# Patient Record
Sex: Male | Born: 1980 | Race: White | Hispanic: No | Marital: Married | Smoking: Current every day smoker
Health system: Southern US, Community
[De-identification: ages and names within clinical notes are randomized; demographics above are authoritative.]

## PROBLEM LIST (undated history)

## (undated) DIAGNOSIS — G4733 Obstructive sleep apnea (adult) (pediatric): Secondary | ICD-10-CM

## (undated) DIAGNOSIS — G4700 Insomnia, unspecified: Secondary | ICD-10-CM

## (undated) DIAGNOSIS — F419 Anxiety disorder, unspecified: Secondary | ICD-10-CM

## (undated) DIAGNOSIS — E669 Obesity, unspecified: Secondary | ICD-10-CM

## (undated) DIAGNOSIS — F17200 Nicotine dependence, unspecified, uncomplicated: Secondary | ICD-10-CM

## (undated) DIAGNOSIS — M25519 Pain in unspecified shoulder: Secondary | ICD-10-CM

## (undated) DIAGNOSIS — R0789 Other chest pain: Secondary | ICD-10-CM

## (undated) DIAGNOSIS — L409 Psoriasis, unspecified: Secondary | ICD-10-CM

## (undated) DIAGNOSIS — R202 Paresthesia of skin: Secondary | ICD-10-CM

## (undated) DIAGNOSIS — T7840XA Allergy, unspecified, initial encounter: Secondary | ICD-10-CM

## (undated) DIAGNOSIS — IMO0002 Reserved for concepts with insufficient information to code with codable children: Secondary | ICD-10-CM

## (undated) DIAGNOSIS — Z720 Tobacco use: Secondary | ICD-10-CM

## (undated) DIAGNOSIS — I10 Essential (primary) hypertension: Secondary | ICD-10-CM

## (undated) DIAGNOSIS — H938X3 Other specified disorders of ear, bilateral: Secondary | ICD-10-CM

## (undated) DIAGNOSIS — R200 Anesthesia of skin: Secondary | ICD-10-CM

## (undated) DIAGNOSIS — R5383 Other fatigue: Secondary | ICD-10-CM

## (undated) DIAGNOSIS — J309 Allergic rhinitis, unspecified: Secondary | ICD-10-CM

## (undated) HISTORY — PX: TENDON REPAIR: SHX5111

## (undated) HISTORY — DX: Allergy, unspecified, initial encounter: T78.40XA

## (undated) HISTORY — DX: Other chest pain: R07.89

## (undated) HISTORY — DX: Pain in unspecified shoulder: M25.519

## (undated) HISTORY — DX: Obesity, unspecified: E66.9

## (undated) HISTORY — DX: Anesthesia of skin: R20.0

## (undated) HISTORY — DX: Nicotine dependence, unspecified, uncomplicated: F17.200

## (undated) HISTORY — DX: Other specified disorders of ear, bilateral: H93.8X3

## (undated) HISTORY — DX: Obstructive sleep apnea (adult) (pediatric): G47.33

## (undated) HISTORY — DX: Tobacco use: Z72.0

## (undated) HISTORY — DX: Reserved for concepts with insufficient information to code with codable children: IMO0002

## (undated) HISTORY — DX: Allergic rhinitis, unspecified: J30.9

## (undated) HISTORY — DX: Psoriasis, unspecified: L40.9

## (undated) HISTORY — DX: Other fatigue: R53.83

## (undated) HISTORY — DX: Paresthesia of skin: R20.2

## (undated) HISTORY — DX: Essential (primary) hypertension: I10

## (undated) HISTORY — DX: Insomnia, unspecified: G47.00

---
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
## 2000-08-28 ENCOUNTER — Emergency Department (HOSPITAL_COMMUNITY): Admission: EM | Admit: 2000-08-28 | Discharge: 2000-08-28 | Payer: Self-pay | Admitting: Podiatry

## 2000-08-28 ENCOUNTER — Encounter: Payer: Self-pay | Admitting: Emergency Medicine

## 2000-08-31 ENCOUNTER — Ambulatory Visit (HOSPITAL_COMMUNITY): Admission: RE | Admit: 2000-08-31 | Discharge: 2000-08-31 | Payer: Self-pay | Admitting: *Deleted

## 2000-08-31 ENCOUNTER — Encounter: Payer: Self-pay | Admitting: *Deleted

## 2003-05-10 ENCOUNTER — Emergency Department (HOSPITAL_COMMUNITY): Admission: EM | Admit: 2003-05-10 | Discharge: 2003-05-10 | Payer: Self-pay | Admitting: Emergency Medicine

## 2004-12-30 ENCOUNTER — Emergency Department (HOSPITAL_COMMUNITY): Admission: EM | Admit: 2004-12-30 | Discharge: 2004-12-30 | Payer: Self-pay | Admitting: Emergency Medicine

## 2005-04-28 ENCOUNTER — Emergency Department (HOSPITAL_COMMUNITY): Admission: EM | Admit: 2005-04-28 | Discharge: 2005-04-29 | Payer: Self-pay | Admitting: Emergency Medicine

## 2005-10-29 ENCOUNTER — Emergency Department (HOSPITAL_COMMUNITY): Admission: EM | Admit: 2005-10-29 | Discharge: 2005-10-30 | Payer: Self-pay | Admitting: Emergency Medicine

## 2010-07-30 ENCOUNTER — Encounter: Payer: Self-pay | Admitting: *Deleted

## 2010-07-31 ENCOUNTER — Institutional Professional Consult (permissible substitution): Payer: Self-pay | Admitting: Cardiovascular Disease

## 2010-08-13 ENCOUNTER — Institutional Professional Consult (permissible substitution): Payer: Self-pay | Admitting: Cardiovascular Disease

## 2011-12-07 ENCOUNTER — Other Ambulatory Visit: Payer: Self-pay | Admitting: Family Medicine

## 2011-12-08 NOTE — Telephone Encounter (Signed)
Chart pulled to PA pool at nurse station [phone]

## 2012-07-13 ENCOUNTER — Emergency Department (HOSPITAL_COMMUNITY)
Admission: EM | Admit: 2012-07-13 | Discharge: 2012-07-13 | Disposition: A | Discharge status: Home / Self Care | Payer: Worker's Compensation | Attending: Emergency Medicine | Admitting: Emergency Medicine

## 2012-07-13 ENCOUNTER — Encounter (HOSPITAL_COMMUNITY): Payer: Self-pay

## 2012-07-13 DIAGNOSIS — Z77098 Contact with and (suspected) exposure to other hazardous, chiefly nonmedicinal, chemicals: Secondary | ICD-10-CM | POA: Insufficient documentation

## 2012-07-13 DIAGNOSIS — Z8739 Personal history of other diseases of the musculoskeletal system and connective tissue: Secondary | ICD-10-CM | POA: Insufficient documentation

## 2012-07-13 DIAGNOSIS — E669 Obesity, unspecified: Secondary | ICD-10-CM | POA: Insufficient documentation

## 2012-07-13 MED ORDER — ALBUTEROL SULFATE (5 MG/ML) 0.5% IN NEBU
5.0000 mg | INHALATION_SOLUTION | Freq: Once | RESPIRATORY_TRACT | Status: AC
  Administered 2012-07-13: 5 mg via RESPIRATORY_TRACT
  Filled 2012-07-13: qty 1

## 2012-07-13 MED ORDER — IPRATROPIUM BROMIDE 0.02 % IN SOLN
0.5000 mg | Freq: Once | RESPIRATORY_TRACT | Status: AC
  Administered 2012-07-13: 0.5 mg via RESPIRATORY_TRACT
  Filled 2012-07-13: qty 2.5

## 2012-07-13 NOTE — ED Provider Notes (Signed)
History     CSN: [id number]  Arrival date & time 07/13/12  1421   First MD Initiated Contact with Patient 07/13/12 1456      Chief Complaint  Patient presents with  . Chemical Exposure    (Consider location/radiation/quality/duration/timing/severity/associated sxs/prior treatment) HPI Comments: Patient presents with complaint of chlorine exposure 5 days ago while at work. He was working on a pump at a swimming pool. Patient states that that evening over the weekend he had a burning feeling in his chest and throat. This has since resolved and the patient's only current complaint is becoming short of breath more quickly while at work. Patient was told by his employer to come to the emergency department for evaluation. No history of asthma or other heart or lung problems. Onset symptoms acute. Course is resolved. Nothing makes symptoms better or worse.  The history is provided by the patient.    Past Medical History  Diagnosis Date  . Non-cardiac chest pain   . Smoker   . Obese   . Herniated disc     History reviewed. No pertinent past surgical history.  Family History  Problem Relation Age of Onset  . Heart attack Father   . Stroke Father   . Heart attack Paternal Uncle     History  Substance Use Topics  . Smoking status: Not on file  . Smokeless tobacco: Not on file  . Alcohol Use: Not on file      Review of Systems  Constitutional: Negative for fever.  HENT: Positive for sore throat (resolved). Negative for rhinorrhea.   Eyes: Negative for redness.  Respiratory: Positive for shortness of breath (resolved). Negative for cough.   Cardiovascular: Negative for chest pain.  Gastrointestinal: Negative for nausea, vomiting, abdominal pain and diarrhea.  Genitourinary: Negative for dysuria.  Musculoskeletal: Negative for myalgias.  Skin: Negative for rash.  Neurological: Negative for headaches.    Allergies  Review of patient's allergies indicates no known  allergies.  Home Medications   Current Outpatient Rx  Name  Route  Sig  Dispense  Refill  . clonazePAM (KLONOPIN) 0.5 MG tablet   Oral   Take 0.5 mg by mouth 2 (two) times daily as needed for anxiety.           BP 148/91  Pulse 100  Temp(Src) 98.5 F (36.9 C) (Oral)  Resp 18  SpO2 96%  Physical Exam  Nursing note and vitals reviewed. Constitutional: He appears well-developed and well-nourished.  HENT:  Head: Normocephalic and atraumatic.  Nose: Nose normal. No mucosal edema or rhinorrhea.  Mouth/Throat: Oropharynx is clear and moist and mucous membranes are normal. No edematous. No oropharyngeal exudate, posterior oropharyngeal edema or posterior oropharyngeal erythema.  Eyes: Conjunctivae are normal. Right eye exhibits no discharge. Left eye exhibits no discharge.  Neck: Normal range of motion. Neck supple.  Cardiovascular: Normal rate, regular rhythm and normal heart sounds.   Pulmonary/Chest: Effort normal and breath sounds normal. No respiratory distress. He has no wheezes. He has no rales.  Abdominal: Soft. There is no tenderness.  Neurological: He is alert.  Skin: Skin is warm and dry.  Psychiatric: He has a normal mood and affect.    ED Course  Procedures (including critical care time)  Labs Reviewed - No data to display No results found.   1. Exposure to chemical inhalation     3:05 PM Patient seen and examined. I have spoken with [facility]. They recommend O2 if needed, breathing treatment. Otherwise  continued supportive care and cool liquids for throat irritation.   Vital signs reviewed and are as follows: Filed Vitals:   07/13/12 1455  BP: 148/91  Pulse: 100  Temp: 98.5 F (36.9 C)  Resp: 18   3:57 PM Pt states he feels shaky, breathing better after treatment. Discussed need to avoid exposure, return with worsening symptoms or shortness of breath. Patient verbalizes understanding and agrees with plan.     MDM  Inhalation  exposure to irritant, improving. No concern for d/c. Reccs from poison control.        Renne Crigler, PA-C 07/13/12 [phone]

## 2012-07-13 NOTE — ED Provider Notes (Signed)
Medical screening examination/treatment/procedure(s) were performed by non-physician practitioner and as supervising physician I was immediately available for consultation/collaboration.  Bessie Boyte R. Laticha Ferrucci, MD 07/13/12 2328 

## 2012-07-13 NOTE — ED Notes (Addendum)
Pt presents with c/o chemical exposure that happened on 5/9. 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 No acute distress at this time. Pt was experiencing shortness of breath and chest pain over the weekend. Still feels a burning sensation in his chest and some mild shortness of breath at this time.

## 2013-04-09 ENCOUNTER — Encounter (HOSPITAL_COMMUNITY): Payer: Self-pay | Admitting: Emergency Medicine

## 2013-04-09 ENCOUNTER — Emergency Department (HOSPITAL_COMMUNITY)
Admission: EM | Admit: 2013-04-09 | Discharge: 2013-04-09 | Disposition: A | Discharge status: Home / Self Care | Payer: BC Managed Care – PPO | Source: Home / Self Care | Attending: Emergency Medicine | Admitting: Emergency Medicine

## 2013-04-09 ENCOUNTER — Emergency Department (HOSPITAL_COMMUNITY)
Admission: EM | Admit: 2013-04-09 | Discharge: 2013-04-10 | Disposition: A | Discharge status: Home / Self Care | Payer: BC Managed Care – PPO | Attending: Emergency Medicine | Admitting: Emergency Medicine

## 2013-04-09 DIAGNOSIS — K5289 Other specified noninfective gastroenteritis and colitis: Secondary | ICD-10-CM

## 2013-04-09 DIAGNOSIS — E669 Obesity, unspecified: Secondary | ICD-10-CM | POA: Insufficient documentation

## 2013-04-09 DIAGNOSIS — L539 Erythematous condition, unspecified: Secondary | ICD-10-CM | POA: Insufficient documentation

## 2013-04-09 DIAGNOSIS — Z8739 Personal history of other diseases of the musculoskeletal system and connective tissue: Secondary | ICD-10-CM | POA: Insufficient documentation

## 2013-04-09 DIAGNOSIS — G43909 Migraine, unspecified, not intractable, without status migrainosus: Secondary | ICD-10-CM | POA: Insufficient documentation

## 2013-04-09 DIAGNOSIS — K529 Noninfective gastroenteritis and colitis, unspecified: Secondary | ICD-10-CM

## 2013-04-09 DIAGNOSIS — Z79899 Other long term (current) drug therapy: Secondary | ICD-10-CM | POA: Insufficient documentation

## 2013-04-09 DIAGNOSIS — F17200 Nicotine dependence, unspecified, uncomplicated: Secondary | ICD-10-CM | POA: Insufficient documentation

## 2013-04-09 DIAGNOSIS — F411 Generalized anxiety disorder: Secondary | ICD-10-CM | POA: Insufficient documentation

## 2013-04-09 HISTORY — DX: Anxiety disorder, unspecified: F41.9

## 2013-04-09 LAB — CBC WITH DIFFERENTIAL/PLATELET
Basophils Absolute: 0 10*3/uL (ref 0.0–0.1)
Basophils Relative: 0 % (ref 0–1)
EOS ABS: 0.1 10*3/uL (ref 0.0–0.7)
Eosinophils Relative: 1 % (ref 0–5)
HCT: 46.6 % (ref 39.0–52.0)
Hemoglobin: 16.6 g/dL (ref 13.0–17.0)
LYMPHS PCT: 12 % (ref 12–46)
Lymphs Abs: 1.4 10*3/uL (ref 0.7–4.0)
MCH: 29.9 pg (ref 26.0–34.0)
MCHC: 35.6 g/dL (ref 30.0–36.0)
MCV: 84 fL (ref 78.0–100.0)
Monocytes Absolute: 0.6 10*3/uL (ref 0.1–1.0)
Monocytes Relative: 5 % (ref 3–12)
Neutro Abs: 9.6 10*3/uL — ABNORMAL HIGH (ref 1.7–7.7)
Neutrophils Relative %: 82 % — ABNORMAL HIGH (ref 43–77)
PLATELETS: 236 10*3/uL (ref 150–400)
RBC: 5.55 MIL/uL (ref 4.22–5.81)
RDW: 13.1 % (ref 11.5–15.5)
WBC: 11.7 10*3/uL — AB (ref 4.0–10.5)

## 2013-04-09 LAB — POCT I-STAT, CHEM 8
BUN: 8 mg/dL (ref 6–23)
Calcium, Ion: 1.04 mmol/L — ABNORMAL LOW (ref 1.12–1.23)
Chloride: 108 mEq/L (ref 96–112)
Creatinine, Ser: 0.9 mg/dL (ref 0.50–1.35)
Glucose, Bld: 108 mg/dL — ABNORMAL HIGH (ref 70–99)
HEMATOCRIT: 52 % (ref 39.0–52.0)
HEMOGLOBIN: 17.7 g/dL — AB (ref 13.0–17.0)
POTASSIUM: 4.6 meq/L (ref 3.7–5.3)
SODIUM: 139 meq/L (ref 137–147)
TCO2: 24 mmol/L (ref 0–100)

## 2013-04-09 MED ORDER — HYDROMORPHONE HCL PF 1 MG/ML IJ SOLN
INTRAMUSCULAR | Status: AC
  Filled 2013-04-09: qty 1

## 2013-04-09 MED ORDER — ONDANSETRON HCL 4 MG/2ML IJ SOLN
4.0000 mg | Freq: Once | INTRAMUSCULAR | Status: AC
  Administered 2013-04-09: 4 mg via INTRAVENOUS

## 2013-04-09 MED ORDER — SODIUM CHLORIDE 0.9 % IV SOLN: INTRAVENOUS | Status: DC

## 2013-04-09 MED ORDER — HYDROMORPHONE HCL PF 1 MG/ML IJ SOLN
1.0000 mg | Freq: Once | INTRAMUSCULAR | Status: AC
  Administered 2013-04-09: 1 mg via INTRAMUSCULAR

## 2013-04-09 MED ORDER — ONDANSETRON 4 MG PO TBDP
ORAL_TABLET | ORAL | Status: AC
  Filled 2013-04-09: qty 1

## 2013-04-09 MED ORDER — PROMETHAZINE HCL 25 MG/ML IJ SOLN
25.0000 mg | Freq: Once | INTRAMUSCULAR | Status: AC
  Administered 2013-04-10: 25 mg via INTRAVENOUS
  Filled 2013-04-09: qty 1

## 2013-04-09 MED ORDER — SODIUM CHLORIDE 0.9 % IV BOLUS (SEPSIS)
1000.0000 mL | Freq: Once | INTRAVENOUS | Status: AC
  Administered 2013-04-09: 1000 mL via INTRAVENOUS

## 2013-04-09 NOTE — Discharge Instructions (Signed)
We have determined that your problem requires further evaluation in the emergency department.  We will take care of your transport there.  Once at the emergency department, you will be evaluated by a provider and they will order whatever treatment or tests they deem necessary.  We cannot guarantee that they will do any specific test or do any specific treatment.  ° °

## 2013-04-09 NOTE — ED Notes (Signed)
zofran given orally. Pt tolerated well.

## 2013-04-09 NOTE — ED Notes (Addendum)
Patient c/o flu like sx including cough, fever, bodyaches, and nausea and vomitting x 1 day. Pt reports he has been around family members who are also sick with flu like sx. Patient reports he took Tylenol today but then vomitted about 30 mins later. Pt is alert and oriented. Face is red and patient reports he vomitted once in the lobby and still feels nauseous. Has chills currently. Given warm blanket.

## 2013-04-09 NOTE — ED Notes (Signed)
C/o nausea, vomiting, and diarrhea since last night.  States [facility] and given pain medication (for headache) and Zofran.  Denies pain or nausea at this time.

## 2013-04-09 NOTE — ED Provider Notes (Signed)
Chief Complaint   Chief Complaint  Patient presents with  . Influenza     History of Present Illness   Douglas Vazquez is a 33 year old male since yesterday evening and has had nausea, vomiting, and diarrhea. His wife thinks that some of the initial emesis might of been coffee-ground emesis, however I was able to observe his emesis today and it looks like yellow mucus. The patient estimates that he vomited twice yesterday and 5-6 times today. He had one or 2 diarrheal stools yesterday and 6-7 today. He also notes cramps and borborygmus. He's been weak, dizzy, had difficulty ambulating. He's also had some rhinorrhea and some wheezing. Denies any fever or chills. No blood in the stool. No specific exposures. No suspicious ingestions.  Review of Systems   Other than as noted above, the patient denies any of the following symptoms: Systemic:  No fevers, chills, sweats, weight loss or gain, fatigue, or tiredness. ENT:  No nasal congestion, rhinorrhea, or sore throat. Lungs:  No cough, wheezing, or shortness of breath. Cardiac:  No chest pain, syncope, or presyncope. GI:  No abdominal pain, nausea, vomiting, anorexia, diarrhea, constipation, blood in stool or vomitus. GU:  No dysuria, frequency, or urgency.  PMFSH   Past medical history, family history, social history, meds, and allergies were reviewed.  His only medication is clonazepam.  Physical Exam     Vital signs:  BP 151/107  Pulse 98  Temp(Src) 98.7 F (37.1 C) (Oral)  Resp 18  SpO2 98% Filed Vitals:   04/09/13 1932 04/09/13 2025 Supine 04/09/13 2028 Sitting  04/09/13 2029 Standing   BP: 150/101 146/84 137/84 151/107  Pulse: 72 94 89 98  Temp: 98.7 F (37.1 C)     TempSrc: Oral     Resp: 18     SpO2: 98%       General:  Alert and oriented.  In no distress.  Skin warm and dry.  Good skin turgor, brisk capillary refill. ENT:  No scleral icterus, moist mucous membranes, no oral lesions, pharynx clear. Lungs:   Breath sounds clear and equal bilaterally.  No wheezes, rales, or rhonchi. Heart:  Rhythm regular, without extrasystoles.  No gallops or murmers. Abdomen:  Soft, flat, nondistended no organomegaly or mass. Bowel sounds were hyperactive. No tenderness to palpation. Skin: Clear, warm, and dry.  Good turgor.  Brisk capillary refill.  Labs   Results for orders placed during the hospital encounter of 04/09/13  POCT I-STAT, CHEM 8      Result Value Range   Sodium 139  137 - 147 mEq/L   Potassium 4.6  3.7 - 5.3 mEq/L   Chloride 108  96 - 112 mEq/L   BUN 8  6 - 23 mg/dL   Creatinine, Ser 9.81  0.50 - 1.35 mg/dL   Glucose, Bld 191 (*) 70 - 99 mg/dL   Calcium, Ion 4.78 (*) 1.12 - 1.23 mmol/L   TCO2 24  0 - 100 mmol/L   Hemoglobin 17.7 (*) 13.0 - 17.0 g/dL   HCT 29.5  62.1 - 30.8 %     Course in Urgent Care Center   He was given Zofran 4 mg IM and Dilaudid 1 mg IM because of a severe headache. We attempted to start IV normal saline but were unable to secure an IV line. This is possibly due to dehydration.   Assessment   The encounter diagnosis was Gastroenteritis.  Plan   The patient was transferred to the ED via shuttle in  stable condition.  Medical Decision Making   33 year old male with 2 day history of multiple episodes of emesis and diarrhea.  We were unable to establish an IV here due to dehydration.  I believe he needs IV fluids.  Will send via shuttle.         Reuben Likesavid C Adrien Shankar, MD 04/09/13 2106

## 2013-04-10 LAB — COMPREHENSIVE METABOLIC PANEL
ALT: 35 U/L (ref 0–53)
AST: 22 U/L (ref 0–37)
Albumin: 4.1 g/dL (ref 3.5–5.2)
Alkaline Phosphatase: 119 U/L — ABNORMAL HIGH (ref 39–117)
BUN: 8 mg/dL (ref 6–23)
CO2: 24 mEq/L (ref 19–32)
Calcium: 9.6 mg/dL (ref 8.4–10.5)
Chloride: 105 mEq/L (ref 96–112)
Creatinine, Ser: 0.81 mg/dL (ref 0.50–1.35)
GFR calc non Af Amer: >90 mL/min (ref >90)
Glucose, Bld: 101 mg/dL — ABNORMAL HIGH (ref 70–99)
POTASSIUM: 4.2 meq/L (ref 3.7–5.3)
SODIUM: 143 meq/L (ref 137–147)
TOTAL PROTEIN: 8.1 g/dL (ref 6.0–8.3)
Total Bilirubin: 0.6 mg/dL (ref 0.3–1.2)

## 2013-04-10 LAB — LIPASE, BLOOD: Lipase: 33 U/L (ref 11–59)

## 2013-04-10 MED ORDER — PROMETHAZINE HCL 25 MG PO TABS: 25.0000 mg | ORAL_TABLET | Freq: Four times a day (QID) | ORAL | Status: DC | PRN

## 2013-04-10 NOTE — ED Provider Notes (Signed)
CSN: [id number]     Arrival date & time 04/09/13  2112 History   First MD Initiated Contact with Patient 04/09/13 2335     Chief Complaint  Patient presents with  . Emesis  . Diarrhea   (Consider location/radiation/quality/duration/timing/severity/associated sxs/prior Treatment) HPI Comments: Patient is 33 year old male with PMHx of migraine headaches who presented to Urgent Care with nausea, vomiting and diarrhea since yesterday - he reports multiple episodes of NBNB vomiting.  He states diarrhea without blood x 2 episodes yesterday but none today.  He denies fever, chills, abdominal pain, constipation, dysuria, hematuria.  He reports no other sick contacts.  He reports headache started with this as well.  He denies photophobia as well.  He was sent here for IV fluids.  Since waiting here for several hours he states that the nausea returned but no active vomiting.  Patient is a 33 y.o. male presenting with vomiting and diarrhea. The history is provided by the patient. No language interpreter was used.  Emesis Severity:  Moderate Timing:  Constant Number of daily episodes:  5-6 Quality:  Stomach contents Progression:  Worsening Associated symptoms: diarrhea   Diarrhea Associated symptoms: vomiting     Past Medical History  Diagnosis Date  . Non-cardiac chest pain   . Smoker   . Obese   . Herniated disc   . Anxiety    History reviewed. No pertinent past surgical history. Family History  Problem Relation Age of Onset  . Heart attack Father   . Stroke Father   . Heart attack Paternal Uncle    History  Substance Use Topics  . Smoking status: Current Every Day Smoker -- 0.50 packs/day  . Smokeless tobacco: Not on file  . Alcohol Use: No    Review of Systems  Gastrointestinal: Positive for vomiting and diarrhea.  All other systems reviewed and are negative.    Allergies  Review of patient's allergies indicates no known allergies.  Home Medications   Current Outpatient  Rx  Name  Route  Sig  Dispense  Refill  . acetaminophen (TYLENOL) 500 MG tablet   Oral   Take 1,000 mg by mouth every 8 (eight) hours as needed for moderate pain or headache.         . clonazePAM (KLONOPIN) 0.5 MG tablet   Oral   Take 0.5 mg by mouth daily as needed for anxiety.          . promethazine (PHENERGAN) 25 MG tablet   Oral   Take 1 tablet (25 mg total) by mouth every 6 (six) hours as needed for nausea or vomiting.   30 tablet   0    BP 151/109  Pulse 97  Temp(Src) 98.1 F (36.7 C) (Oral)  Resp 20  Ht 5\' 9"  (1.753 m)  Wt 233 lb (105.688 kg)  BMI 34.39 kg/m2  SpO2 97% Physical Exam  Nursing note and vitals reviewed. Constitutional: He is oriented to person, place, and time. He appears well-developed and well-nourished. No distress.  HENT:  Head: Normocephalic and atraumatic.  Right Ear: External ear normal.  Left Ear: External ear normal.  Nose: Nose normal.  Mouth/Throat: Oropharynx is clear and moist. No oropharyngeal exudate.  Mild periorbital erythema  Eyes: Conjunctivae are normal. Pupils are equal, round, and reactive to light. No scleral icterus.  Neck: Normal range of motion. Neck supple.  Cardiovascular: Normal rate, regular rhythm and normal heart sounds.  Exam reveals no gallop and no friction rub.   No  murmur heard. Pulmonary/Chest: Effort normal and breath sounds normal. No respiratory distress. He has no wheezes. He has no rales. He exhibits no tenderness.  Abdominal: Soft. Bowel sounds are normal. He exhibits no distension. There is no tenderness. There is no rebound and no guarding.  Musculoskeletal: Normal range of motion. He exhibits no edema and no tenderness.  Lymphadenopathy:    He has no cervical adenopathy.  Neurological: He is alert and oriented to person, place, and time. He exhibits normal muscle tone. Coordination normal.  Skin: Skin is warm and dry. No rash noted. No erythema. No pallor.  Psychiatric: He has a normal mood and  affect. His behavior is normal. Judgment and thought content normal.    ED Course  Procedures (including critical care time) Labs Review Labs Reviewed  CBC WITH DIFFERENTIAL - Abnormal; Notable for the following:    WBC 11.7 (*)    Neutrophils Relative % 82 (*)    Neutro Abs 9.6 (*)    All other components within normal limits  COMPREHENSIVE METABOLIC PANEL - Abnormal; Notable for the following:    Glucose, Bld 101 (*)    Alkaline Phosphatase 119 (*)    All other components within normal limits  LIPASE, BLOOD  URINALYSIS, ROUTINE W REFLEX MICROSCOPIC   Imaging Review No results found.  EKG Interpretation   None       MDM   1. Gastroenteritis    Patient reports improvement in symptoms after a liter of fluids and phenergan here - will give rx for same and discharge home.  No concern for acute abdominal emergency such as appendicitis, perforated ulcer, colitis, diverticulitis or cholecystitis.   Izola PriceFrances C. Marisue HumbleSanford, PA-C 04/10/13 0130

## 2013-04-10 NOTE — Discharge Instructions (Signed)
Viral Gastroenteritis Viral gastroenteritis is also known as stomach flu. This condition affects the stomach and intestinal tract. It can cause sudden diarrhea and vomiting. The illness typically lasts 3 to 8 days. Most people develop an immune response that eventually gets rid of the virus. While this natural response develops, the virus can make you quite ill. CAUSES  Many different viruses can cause gastroenteritis, such as rotavirus or noroviruses. You can catch one of these viruses by consuming contaminated food or water. You may also catch a virus by sharing utensils or other personal items with an infected person or by touching a contaminated surface. SYMPTOMS  The most common symptoms are diarrhea and vomiting. These problems can cause a severe loss of body fluids (dehydration) and a body salt (electrolyte) imbalance. Other symptoms may include:  Fever.  Headache.  Fatigue.  Abdominal pain. DIAGNOSIS  Your caregiver can usually diagnose viral gastroenteritis based on your symptoms and a physical exam. A stool sample may also be taken to test for the presence of viruses or other infections. TREATMENT  This illness typically goes away on its own. Treatments are aimed at rehydration. The most serious cases of viral gastroenteritis involve vomiting so severely that you are not able to keep fluids down. In these cases, fluids must be given through an intravenous line (IV). HOME CARE INSTRUCTIONS   Drink enough fluids to keep your urine clear or pale yellow. Drink small amounts of fluids frequently and increase the amounts as tolerated.  Ask your caregiver for specific rehydration instructions.  Avoid:  Foods high in sugar.  Alcohol.  Carbonated drinks.  Tobacco.  Juice.  Caffeine drinks.  Extremely hot or cold fluids.  Fatty, greasy foods.  Too much intake of anything at one time.  Dairy products until 24 to 48 hours after diarrhea stops.  You may consume probiotics.  Probiotics are active cultures of beneficial bacteria. They may lessen the amount and number of diarrheal stools in adults. Probiotics can be found in yogurt with active cultures and in supplements.  Wash your hands well to avoid spreading the virus.  Only take over-the-counter or prescription medicines for pain, discomfort, or fever as directed by your caregiver. Do not give aspirin to children. Antidiarrheal medicines are not recommended.  Ask your caregiver if you should continue to take your regular prescribed and over-the-counter medicines.  Keep all follow-up appointments as directed by your caregiver. SEEK IMMEDIATE MEDICAL CARE IF:   You are unable to keep fluids down.  You do not urinate at least once every 6 to 8 hours.  You develop shortness of breath.  You notice blood in your stool or vomit. This may look like coffee grounds.  You have abdominal pain that increases or is concentrated in one small area (localized).  You have persistent vomiting or diarrhea.  You have a fever.  The patient is a child younger than 3 months, and he or she has a fever.  The patient is a child older than 3 months, and he or she has a fever and persistent symptoms.  The patient is a child older than 3 months, and he or she has a fever and symptoms suddenly get worse.  The patient is a baby, and he or she has no tears when crying. MAKE SURE YOU:   Understand these instructions.  Will watch your condition.  Will get help right away if you are not doing well or get worse. Document Released: 02/16/2005 Document Revised: 05/11/2011 Document Reviewed: 12/03/2010   Exit[employer] Patient Information 2014 [employer].  

## 2013-04-10 NOTE — ED Provider Notes (Signed)
Medical screening examination/treatment/procedure(s) were performed by non-physician practitioner and as supervising physician I was immediately available for consultation/collaboration.  EKG Interpretation   None        Sunnie NielsenBrian Kaedin Hicklin, MD 04/10/13 2308

## 2013-08-26 ENCOUNTER — Ambulatory Visit (INDEPENDENT_AMBULATORY_CARE_PROVIDER_SITE_OTHER): Payer: BC Managed Care – PPO | Admitting: Family Medicine

## 2013-08-26 VITALS — BP 122/90 | HR 96 | Temp 98.8°F | Resp 16 | Ht 68.0 in | Wt 236.0 lb

## 2013-08-26 DIAGNOSIS — B029 Zoster without complications: Secondary | ICD-10-CM

## 2013-08-26 MED ORDER — VALACYCLOVIR HCL 1 G PO TABS: 1000.0000 mg | ORAL_TABLET | Freq: Three times a day (TID) | ORAL | Status: DC

## 2013-08-26 MED ORDER — OXYCODONE-ACETAMINOPHEN 7.5-325 MG PO TABS: 1.0000 | ORAL_TABLET | ORAL | Status: DC | PRN

## 2013-08-26 NOTE — Patient Instructions (Signed)
Shingles °Shingles (herpes zoster) is an infection that is caused by the same virus that causes chickenpox (varicella). The infection causes a painful skin rash and fluid-filled blisters, which eventually break open, crust over, and heal. It may occur in any area of the body, but it usually affects only one side of the body or face. The pain of shingles usually lasts about 1 month. However, some people with shingles may develop long-term (chronic) pain in the affected area of the body. °Shingles often occurs many years after the person had chickenpox. It is more common: °· In people older than 50 years. °· In people with weakened immune systems, such as those with HIV, AIDS, or cancer. °· In people taking medicines that weaken the immune system, such as transplant medicines. °· In people under great stress. °CAUSES  °Shingles is caused by the varicella zoster virus (VZV), which also causes chickenpox. After a person is infected with the virus, it can remain in the person's body for years in an inactive state (dormant). To cause shingles, the virus reactivates and breaks out as an infection in a nerve root. °The virus can be spread from person to person (contagious) through contact with open blisters of the shingles rash. It will only spread to people who have not had chickenpox. When these people are exposed to the virus, they may develop chickenpox. They will not develop shingles. Once the blisters scab over, the person is no longer contagious and cannot spread the virus to others. °SYMPTOMS  °Shingles shows up in stages. The initial symptoms may be pain, itching, and tingling in an area of the skin. This pain is usually described as burning, stabbing, or throbbing. In a few days or weeks, a painful red rash will appear in the area where the pain, itching, and tingling were felt. The rash is usually on one side of the body in a band or belt-like pattern. Then, the rash usually turns into fluid-filled blisters. They  will scab over and dry up in approximately 2-3 weeks. °Flu-like symptoms may also occur with the initial symptoms, the rash, or the blisters. These may include: °· Fever. °· Chills. °· Headache. °· Upset stomach. °DIAGNOSIS  °Your caregiver will perform a skin exam to diagnose shingles. Skin scrapings or fluid samples may also be taken from the blisters. This sample will be examined under a microscope or sent to a lab for further testing. °TREATMENT  °There is no specific cure for shingles. Your caregiver will likely prescribe medicines to help you manage the pain, recover faster, and avoid long-term problems. This may include antiviral drugs, anti-inflammatory drugs, and pain medicines. °HOME CARE INSTRUCTIONS  °· Take a cool bath or apply cool compresses to the area of the rash or blisters as directed. This may help with the pain and itching.   °· Only take over-the-counter or prescription medicines as directed by your caregiver.   °· Rest as directed by your caregiver. °· Keep your rash and blisters clean with mild soap and cool water or as directed by your caregiver.  °· Do not pick your blisters or scratch your rash. Apply an anti-itch cream or numbing creams to the affected area as directed by your caregiver. °· Keep your shingles rash covered with a loose bandage (dressing). °· Avoid skin contact with: °¨ Babies.   °¨ Pregnant women.   °¨ Children with eczema.   °¨ Elderly people with transplants.   °¨ People with chronic illnesses, such as leukemia or AIDS.   °· Wear loose-fitting clothing to help ease the   pain of material rubbing against the rash. °· Keep all follow-up appointments with your caregiver. If the area involved is on your face, you may receive a referral for follow-up to a specialist, such as an eye doctor (ophthalmologist) or an ear, nose, and throat (ENT) doctor. Keeping all follow-up appointments will help you avoid eye complications, chronic pain, or disability.   °SEEK IMMEDIATE MEDICAL  CARE IF:  °· You have facial pain, pain around the eye area, or loss of feeling on one side of your face. °· You have ear pain or ringing in your ear. °· You have loss of taste. °· Your pain is not relieved with prescribed medicines.   °· Your redness or swelling spreads.   °· You have more pain and swelling.  °· Your condition is worsening or has changed.   °· You have a fever or persistent symptoms for more than 2-3 days. °· You have a fever and your symptoms suddenly get worse. °MAKE SURE YOU: °· Understand these instructions. °· Will watch your condition. °· Will get help right away if you are not doing well or get worse. °Document Released: 02/16/2005 Document Revised: 11/11/2011 Document Reviewed: 10/01/2011 °[employer]® Patient Information ©2015 [employer]. This information is not intended to replace advice given to you by your health care provider. Make sure you discuss any questions you have with your health care provider. ° °

## 2013-08-26 NOTE — Progress Notes (Signed)
The chart was scribed for Douglas SidleKurt Lauenstein, MD, by Douglas Vazquez, ED Scribe. This patient's care was started at 5:56 PM.  Patient ID: Douglas KindleBryan J Keeny MRN: [id number], DOB: [birth year], 33 y.o. Date of Encounter: 08/26/2013, 5:56 PM  Primary Physician: No PCP Per Patient  Chief Complaint:  Chief Complaint  Patient presents with   Herpes Zoster    Was diagnosed with shingles yesterday and given Zovirax but today he is much worse     HPI: 33 y.o. year old male with history below presents with a herpes zoster infection to his right hip which he first noticed yesterday. He states the swelling and redness associated with the infection have increased, and he reports  worsening pain. He also reports he had a headache this week prior to developing the lesion. The pt was treated in the ED yesterday for similar symptoms and prescribed acyclovir. Mr. Douglas Vazquez has had shingles one previously.   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 He has four children.    Past Medical History  Diagnosis Date   Non-cardiac chest pain    Smoker    Obese    Herniated disc    Anxiety      Home Meds: Prior to Admission medications   Medication Sig Start Date End Date Taking? Authorizing Provider  acyclovir (ZOVIRAX) 400 MG tablet Take 400 mg by mouth 5 (five) times daily.   Yes Historical Provider, MD  clonazePAM (KLONOPIN) 0.5 MG tablet Take 0.5 mg by mouth daily as needed for anxiety.     Historical Provider, MD    Allergies: No Known Allergies  History   Social History   Marital Status: Married    Spouse Name: N/A    Number of Children: N/A   Years of Education: N/A   Occupational History   Not on file.   Social History Main Topics   Smoking status: Current Every Day Smoker -- 0.50 packs/day   Smokeless tobacco: Not on file   Alcohol Use: No   Drug Use: No   Sexual Activity: Not on file   Other Topics Concern   Not on file   Social History Narrative     No narrative on file     Review of Systems: Constitutional: negative for chills, fever, night sweats, weight changes, or fatigue  HEENT: negative for vision changes, hearing loss, congestion, rhinorrhea, ST, epistaxis, or sinus pressure Cardiovascular: negative for chest pain or palpitations Respiratory: negative for hemoptysis, wheezing, shortness of breath, or cough Abdominal: negative for abdominal pain, nausea, vomiting, diarrhea, or constipation Dermatological: positive for a lesion, redness, and swelling  Neurologic: positive for a headache; negative for dizziness or syncope All other systems reviewed and are otherwise negative with the exception to those above and in the HPI.   Physical Exam: Triage Vitals: Blood pressure 122/90, pulse 96, temperature 98.8 F (37.1 C), resp. rate 16, height 5\' 8"  (1.727 m), weight 236 lb (107.049 kg), SpO2 96.00%., Body mass index is 35.89 kg/(m^2).  General: Well developed, well nourished, in no acute distress. Head: Normocephalic, atraumatic, eyes without discharge, sclera non-icteric, nares are without discharge. Bilateral auditory canals clear, TM's are without perforation, pearly grey and translucent with reflective cone of light bilaterally. Oral cavity moist, posterior pharynx without exudate, erythema, peritonsillar abscess, or post nasal drip.  Neck: Supple. No thyromegaly. Full ROM. No lymphadenopathy. Lungs: Clear bilaterally to auscultation without wheezes, rales, or rhonchi. Breathing is unlabored. Heart: RRR with S1 S2. No murmurs, rubs,  or gallops appreciated. Abdomen: Soft, non-tender, non-distended with normoactive bowel sounds. No hepatomegaly. No rebound/guarding. No obvious abdominal masses. Msk:  Strength and tone normal for age. Extremities/Skin: Warm and dry. No clubbing or cyanosis.  Patient has obvious herpetic lesions in the right inguinal crease which are tender and associated with an enlarged lymph node Neuro: Alert  and oriented X 3. Moves all extremities spontaneously. Gait is normal. CNII-XII grossly in tact. Psych:  Responds to questions appropriately with a normal affect.    ASSESSMENT AND PLAN:  33 y.o. year old male with Shingles - Plan: valACYclovir (VALTREX) 1000 MG tablet, oxyCODONE-acetaminophen (PERCOCET) 7.5-325 MG per tablet   Signed, Douglas SidleKurt Lauenstein, MD 08/26/2013 5:56 PM

## 2013-09-14 ENCOUNTER — Telehealth: Payer: Self-pay

## 2013-09-14 NOTE — Telephone Encounter (Signed)
Patient called and states he needs a refill on his pain medication. Please return call and advise. Thank you. CB # [phone]  - please leave message if he does not answer as he is at work right now.

## 2013-09-15 NOTE — Telephone Encounter (Signed)
Patient needs to be seen to get refill on this type of narcotic

## 2013-09-15 NOTE — Telephone Encounter (Signed)
Advised pt to RTC 

## 2013-10-11 ENCOUNTER — Ambulatory Visit (INDEPENDENT_AMBULATORY_CARE_PROVIDER_SITE_OTHER): Payer: BC Managed Care – PPO | Admitting: Emergency Medicine

## 2013-10-11 VITALS — BP 138/84 | HR 100 | Temp 98.8°F | Resp 16 | Ht 68.0 in | Wt 238.6 lb

## 2013-10-11 DIAGNOSIS — M5430 Sciatica, unspecified side: Secondary | ICD-10-CM

## 2013-10-11 DIAGNOSIS — M5431 Sciatica, right side: Secondary | ICD-10-CM

## 2013-10-11 MED ORDER — TRAMADOL HCL 50 MG PO TABS: 50.0000 mg | ORAL_TABLET | Freq: Four times a day (QID) | ORAL | Status: DC | PRN

## 2013-10-11 MED ORDER — NAPROXEN SODIUM 550 MG PO TABS: 550.0000 mg | ORAL_TABLET | Freq: Two times a day (BID) | ORAL | Status: DC

## 2013-10-11 NOTE — Progress Notes (Signed)
[facility] [address], [place] [address] [phone]  Date:  10/11/2013   Name:  Douglas Vazquez   DOB:  [birth year]   MRN:  [id number]  PCP:  No PCP Per Patient    Chief Complaint: Back Pain and Leg Pain   History of Present Illness:  Douglas Vazquez is a 33 y.o. 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  Improved.   Re-injured his back 09/30/13 when lifting a desk and has been out of work for the interval. Seen at ortho urgent care during interval and again at ortho last week and advised to return to work tomorrow with work restrictions and a follow up to discuss MRI 8/27.   Has pain in right low back into right anterior thigh.  Occasionally has numbness and tingling in right foot at times. Concerned about returning to work.  Finishes prednisone tomorrow. On no other medications No improvement with over the counter medications or other home remedies.  Denies other complaint or health concern today.   There are no active problems to display for this patient.   Past Medical History  Diagnosis Date  . Non-cardiac chest pain   . Smoker   . Obese   . Herniated disc   . Anxiety     Past Surgical History  Procedure Laterality Date  . Tendon repair Right     R index finger tendon repair     History  Substance Use Topics  . Smoking status: Current Every Day Smoker -- 0.50 packs/day  . Smokeless tobacco: Not on file  . Alcohol Use: No    Family History  Problem Relation Age of Onset  . Heart attack Father   . Stroke Father   . Hypertension Father   . Diabetes Father   . Heart attack Paternal Uncle   . Anxiety disorder Mother   . Dementia Paternal Grandmother     Allergies  Allergen Reactions  . Doxycycline Rash    Medication list has been reviewed and updated.  Current Outpatient Prescriptions on File Prior to Visit  Medication Sig Dispense Refill   . clonazePAM (KLONOPIN) 0.5 MG tablet Take 0.5 mg by mouth daily as needed for anxiety.        No current facility-administered medications on file prior to visit.    Review of Systems:  As per HPI, otherwise negative.    Physical Examination: Filed Vitals:   10/11/13 1751  BP: 138/84  Pulse: 100  Temp: 98.8 F (37.1 C)  Resp: 16   Filed Vitals:   10/11/13 1751  Height: 5\' 8"  (1.727 m)  Weight: 238 lb 9.6 oz (108.228 kg)   Body mass index is 36.29 kg/(m^2). Ideal Body Weight: Weight in (lb) to have BMI = 25: 164.1  GEN: WDWN, NAD, Non-toxic, A & O x 3 HEENT: Atraumatic, Normocephalic. Neck supple. No masses, No LAD. Ears and Nose: No external deformity. CV: RRR, No M/G/R. No JVD. No thrill. No extra heart sounds. PULM: CTA B, no wheezes, crackles, rhonchi. No retractions. No resp. distress. No accessory muscle use. ABD: S, NT, ND, +BS. No rebound. No HSM. EXTR: No c/c/e NEURO Normal gait. Some weakness right knee extension and absent patellar right PSYCH: Normally interactive. Conversant. Not depressed or anxious appearing.  Calm demeanor.    Assessment and Plan: Sciatic neuritis Tramadol Anaprox Follow up in 1 week if not improved otherwise  with ortho on 8/34  Signed,  Phillips OdorJeffery Elester Apodaca, MD

## 2013-10-11 NOTE — Addendum Note (Signed)
Addended by: Carmelina DaneANDERSON, Hosey Burmester S on: 10/11/2013 07:11 PM   Modules accepted: Level of Service

## 2013-10-11 NOTE — Patient Instructions (Signed)
Sciatic Sciatica Sciatica is pain, weakness, numbness, or tingling along the path of the sciatic nerve. The nerve starts in the lower back and runs down the back of each leg. The nerve controls the muscles in the lower leg and in the back of the knee, while also providing sensation to the back of the thigh, lower leg, and the sole of your foot. Sciatica is a symptom of another medical condition. For instance, nerve damage or certain conditions, such as a herniated disk or bone spur on the spine, pinch or put pressure on the sciatic nerve. This causes the pain, weakness, or other sensations normally associated with sciatica. Generally, sciatica only affects one side of the body. CAUSES   Herniated or slipped disc.  Degenerative disk disease.  A pain disorder involving the narrow muscle in the buttocks (piriformis syndrome).  Pelvic injury or fracture.  Pregnancy.  Tumor (rare). SYMPTOMS  Symptoms can vary from mild to very severe. The symptoms usually travel from the low back to the buttocks and down the back of the leg. Symptoms can include:  Mild tingling or dull aches in the lower back, leg, or hip.  Numbness in the back of the calf or sole of the foot.  Burning sensations in the lower back, leg, or hip.  Sharp pains in the lower back, leg, or hip.  Leg weakness.  Severe back pain inhibiting movement. These symptoms may get worse with coughing, sneezing, laughing, or prolonged sitting or standing. Also, being overweight may worsen symptoms. DIAGNOSIS  Your caregiver will perform a physical exam to look for common symptoms of sciatica. He or she may ask you to do certain movements or activities that would trigger sciatic nerve pain. Other tests may be performed to find the cause of the sciatica. These may include:  Blood tests.  X-rays.  Imaging tests, such as an MRI or CT scan. TREATMENT  Treatment is directed at the cause of the sciatic pain. Sometimes, treatment is not  necessary and the pain and discomfort goes away on its own. If treatment is needed, your caregiver may suggest:  Over-the-counter medicines to relieve pain.  Prescription medicines, such as anti-inflammatory medicine, muscle relaxants, or narcotics.  Applying heat or ice to the painful area.  Steroid injections to lessen pain, irritation, and inflammation around the nerve.  Reducing activity during periods of pain.  Exercising and stretching to strengthen your abdomen and improve flexibility of your spine. Your caregiver may suggest losing weight if the extra weight makes the back pain worse.  Physical therapy.  Surgery to eliminate what is pressing or pinching the nerve, such as a bone spur or part of a herniated disk. HOME CARE INSTRUCTIONS   Only take over-the-counter or prescription medicines for pain or discomfort as directed by your caregiver.  Apply ice to the affected area for 20 minutes, 3-4 times a day for the first 48-72 hours. Then try heat in the same way.  Exercise, stretch, or perform your usual activities if these do not aggravate your pain.  Attend physical therapy sessions as directed by your caregiver.  Keep all follow-up appointments as directed by your caregiver.  Do not wear high heels or shoes that do not provide proper support.  Check your mattress to see if it is too soft. A firm mattress may lessen your pain and discomfort. SEEK IMMEDIATE MEDICAL CARE IF:   You lose control of your bowel or bladder (incontinence).  You have increasing weakness in the lower back, pelvis,  buttocks, or legs.  You have redness or swelling of your back.  You have a burning sensation when you urinate.  You have pain that gets worse when you lie down or awakens you at night.  Your pain is worse than you have experienced in the past.  Your pain is lasting longer than 4 weeks.  You are suddenly losing weight without reason. MAKE SURE YOU:  Understand these  instructions.  Will watch your condition.  Will get help right away if you are not doing well or get worse. Document Released: 02/10/2001 Document Revised: 08/18/2011 Document Reviewed: 06/28/2011 [facility] Patient Information 2015 [place], [place]. This information is not intended to replace advice given to you by your health care provider. Make sure you discuss any questions you have with your health care provider.

## 2014-06-28 ENCOUNTER — Ambulatory Visit (INDEPENDENT_AMBULATORY_CARE_PROVIDER_SITE_OTHER): Payer: Managed Care, Other (non HMO) | Admitting: Physician Assistant

## 2014-06-28 VITALS — BP 140/98 | HR 96 | Temp 98.4°F | Resp 18 | Ht 67.75 in | Wt 237.4 lb

## 2014-06-28 DIAGNOSIS — I10 Essential (primary) hypertension: Secondary | ICD-10-CM | POA: Insufficient documentation

## 2014-06-28 DIAGNOSIS — R05 Cough: Secondary | ICD-10-CM

## 2014-06-28 DIAGNOSIS — J019 Acute sinusitis, unspecified: Secondary | ICD-10-CM

## 2014-06-28 DIAGNOSIS — J309 Allergic rhinitis, unspecified: Secondary | ICD-10-CM

## 2014-06-28 DIAGNOSIS — L409 Psoriasis, unspecified: Secondary | ICD-10-CM | POA: Diagnosis not present

## 2014-06-28 DIAGNOSIS — R059 Cough, unspecified: Secondary | ICD-10-CM

## 2014-06-28 HISTORY — DX: Psoriasis, unspecified: L40.9

## 2014-06-28 HISTORY — DX: Essential (primary) hypertension: I10

## 2014-06-28 HISTORY — DX: Allergic rhinitis, unspecified: J30.9

## 2014-06-28 LAB — COMPREHENSIVE METABOLIC PANEL
ALBUMIN: 4.3 g/dL (ref 3.5–5.2)
ALT: 35 U/L (ref 0–53)
AST: 20 U/L (ref 0–37)
Alkaline Phosphatase: 102 U/L (ref 39–117)
BUN: 10 mg/dL (ref 6–23)
CHLORIDE: 106 meq/L (ref 96–112)
CO2: 26 meq/L (ref 19–32)
Calcium: 9.7 mg/dL (ref 8.4–10.5)
Creat: 0.94 mg/dL (ref 0.50–1.35)
GLUCOSE: 83 mg/dL (ref 70–99)
POTASSIUM: 4 meq/L (ref 3.5–5.3)
SODIUM: 139 meq/L (ref 135–145)
Total Bilirubin: 0.6 mg/dL (ref 0.2–1.2)
Total Protein: 7.7 g/dL (ref 6.0–8.3)

## 2014-06-28 LAB — POCT CBC
GRANULOCYTE PERCENT: 73.2 % (ref 37–80)
HEMATOCRIT: 48.5 % (ref 43.5–53.7)
HEMOGLOBIN: 16.4 g/dL (ref 14.1–18.1)
Lymph, poc: 2.2 (ref 0.6–3.4)
MCH, POC: 28.4 pg (ref 27–31.2)
MCHC: 33.8 g/dL (ref 31.8–35.4)
MCV: 84 fL (ref 80–97)
MID (CBC): 0.9 (ref 0–0.9)
MPV: 7.8 fL (ref 0–99.8)
PLATELET COUNT, POC: 252 10*3/uL (ref 142–424)
POC Granulocyte: 8.4 — AB (ref 2–6.9)
POC LYMPH PERCENT: 18.7 %L (ref 10–50)
POC MID %: 8.1 % (ref 0–12)
RBC: 5.78 M/uL (ref 4.69–6.13)
RDW, POC: 13.6 %
WBC: 11.5 10*3/uL — AB (ref 4.6–10.2)

## 2014-06-28 MED ORDER — HYDROCHLOROTHIAZIDE 12.5 MG PO TABS: 12.5000 mg | ORAL_TABLET | Freq: Every day | ORAL | Status: DC

## 2014-06-28 MED ORDER — LEVOFLOXACIN 500 MG PO TABS: 500.0000 mg | ORAL_TABLET | Freq: Every day | ORAL | Status: DC

## 2014-06-28 MED ORDER — FLUTICASONE PROPIONATE 50 MCG/ACT NA SUSP: 2.0000 | Freq: Every day | NASAL | Status: DC

## 2014-06-28 MED ORDER — CETIRIZINE HCL 10 MG PO TABS: 10.0000 mg | ORAL_TABLET | Freq: Every day | ORAL | Status: DC

## 2014-06-28 NOTE — Patient Instructions (Addendum)
Take antibiotic once a day for 7 days. Use current nasal spray twice a day until you run out. Then use flonase daily. Take zyrtec or benadryl at night.  Take hydrochorithiazide every morning for blood pressure.

## 2014-06-28 NOTE — Progress Notes (Signed)
Subjective:    Patient ID: Douglas Vazquez, male    DOB: 1980/10/20, 34 y.o.   MRN: [id number]  HPI  This is a 34 year old male who is presenting with sinus congestion and pressure x 1 month. Went to [facility] at the start of symptoms - gave him 2 weeks of augmentin. States symptoms improved but did not resolve at the end of treatment. He felt better for 2 days and then symptoms returned. He states "I now feel as bad or worse than before". States he has pain over nasal bridge. States he is having a harder time breathing through mouth. Coughing to clear itch in throat. Cough is not productive. States he has full body myalgias and generally feels exhausted. Denies fever, chills, otalgia, sore throat. [facility] he was given cough syrup, augmentin and a nasal spray. States he feels he is too congested for the nasal spray. Feels the nasal spray drips out. He has a history of environmental allergies. No history of asthma. He is a current every day smoker. He smokes a little under one pack per day.  Patient reports he has had borderline blood pressure for a few years. When he was seen in clinic he had similar blood pressure as today, 150/90s. He does not have a primary provider. He just recently got insurance. He has never been on meds for BP. States he does not exercise and drinks several sodas a day. He knows he needs to lose weight. Patient is also wanting to quit cigarettes. He has been slowly cutting down over the past one year.  Patient has a diagnosis of psoriasis. It generally effects his scalp and eyebrows. Creams in the past have helped but over the past one year or his symptoms have worsened in the creams have been less effective. He has never seen a dermatologist. He states his mother and brother both have psoriasis.  Review of Systems  Constitutional: Positive for fatigue. Negative for fever and chills.  HENT: Positive for congestion and sinus pressure. Negative for ear pain and  sore throat.   Eyes: Positive for discharge and itching. Negative for redness.  Respiratory: Positive for cough. Negative for shortness of breath and wheezing.   Gastrointestinal: Negative for nausea, vomiting and abdominal pain.  Musculoskeletal: Positive for myalgias.  Skin: Positive for rash.  Allergic/Immunologic: Positive for environmental allergies.  Hematological: Negative for adenopathy.  Psychiatric/Behavioral: Positive for sleep disturbance.   There are no active problems to display for this patient.  Home meds: none  Allergies  Allergen Reactions  . Doxycycline Rash   Patient's social and family history were reviewed.     Objective:   Physical Exam  Constitutional: He is oriented to person, place, and time. He appears well-developed and well-nourished. No distress.  HENT:  Head: Normocephalic and atraumatic.  Right Ear: Hearing, tympanic membrane, external ear and ear canal normal.  Left Ear: Hearing, tympanic membrane, external ear and ear canal normal.  Nose: Mucosal edema present. Right sinus exhibits maxillary sinus tenderness. Right sinus exhibits no frontal sinus tenderness. Left sinus exhibits maxillary sinus tenderness. Left sinus exhibits no frontal sinus tenderness.  Mouth/Throat: Uvula is midline and mucous membranes are normal. Posterior oropharyngeal erythema present. No oropharyngeal exudate or posterior oropharyngeal edema.  Eyes: Conjunctivae are normal. Right eye exhibits discharge (watery). Left eye exhibits discharge (watery). Right conjunctiva is not injected. Left conjunctiva is not injected.  Cardiovascular: Normal rate, regular rhythm, normal heart sounds and normal pulses.  No murmur heard. Pulmonary/Chest: Effort normal and breath sounds normal. No respiratory distress. He has no wheezes. He has no rhonchi. He has no rales.  Musculoskeletal: Normal range of motion.  Lymphadenopathy:       Head (right side): No submental, no submandibular and no  tonsillar adenopathy present.       Head (left side): No submental, no submandibular and no tonsillar adenopathy present.    He has no cervical adenopathy.  Neurological: He is alert and oriented to person, place, and time.  Skin: Skin is warm and dry.  Flaking skin under bilateral eyebrows. Scattered scaling lesions in scalp. Scaling lesion on left elbow.  Psychiatric: He has a normal mood and affect. His speech is normal and behavior is normal. Thought content normal.   BP 140/98 mmHg  Pulse 96  Temp(Src) 98.4 F (36.9 C) (Oral)  Resp 18  Ht 5' 7.75" (1.721 m)  Wt 237 lb 6.4 oz (107.684 kg)  BMI 36.36 kg/m2  SpO2 97%  Results for orders placed or performed in visit on 06/28/14  POCT CBC  Result Value Ref Range   WBC 11.5 (A) 4.6 - 10.2 K/uL   Lymph, poc 2.2 0.6 - 3.4   POC LYMPH PERCENT 18.7 10 - 50 %L   MID (cbc) 0.9 0 - 0.9   POC MID % 8.1 0 - 12 %M   POC Granulocyte 8.4 (A) 2 - 6.9   Granulocyte percent 73.2 37 - 80 %G   RBC 5.78 4.69 - 6.13 M/uL   Hemoglobin 16.4 14.1 - 18.1 g/dL   HCT, POC 16.1 09.6 - 53.7 %   MCV 84.0 80 - 97 fL   MCH, POC 28.4 27 - 31.2 pg   MCHC 33.8 31.8 - 35.4 g/dL   RDW, POC 04.5 %   Platelet Count, POC 252 142 - 424 K/uL   MPV 7.8 0 - 99.8 fL       Assessment & Plan:  1. Cough 2. Acute sinusitis 3. Allergic rhinitis Patient symptoms seemed to have allergic involvement however bacterial involvement also likely given white blood cell count of 11.5 with a left shift. Will treat with Levaquin to cover sinus and lung pathogens. He will use Zyrtec at night and Flonase in the morning. He will return if not getting better in 7-10 days. - POCT CBC - levofloxacin (LEVAQUIN) 500 MG tablet; Take 1 tablet (500 mg total) by mouth daily.  Dispense: 7 tablet; Refill: 0 - fluticasone (FLONASE) 50 MCG/ACT nasal spray; Place 2 sprays into both nostrils daily.  Dispense: 16 g; Refill: 12 - cetirizine (ZYRTEC) 10 MG tablet; Take 1 tablet (10 mg total) by  mouth daily.  Dispense: 30 tablet; Refill: 11  4. Essential hypertension We will start treatment for hypertension with hydrochlorothiazide 12.5 mg every morning. He will purchase a blood pressure monitor and take blood pressure readings to 3 times a week. We discussed exercise diet and weight loss. He will return in one month for a CPE and hypertension recheck. - Comprehensive metabolic panel - hydrochlorothiazide (HYDRODIURIL) 12.5 MG tablet; Take 1 tablet (12.5 mg total) by mouth daily.  Dispense: 90 tablet; Refill: 1  5. Psoriasis Patient has noticed worsening psoriasis over the past one year. He does not have a dermatologist nor has he ever had one. He would like to see a dermatologist it would like to wait until his current symptoms have resolved. We will revisit this at his CPE.   Roswell Min[place] Dyke Brackett, MHS  [facility]  06/28/2014

## 2014-07-31 ENCOUNTER — Encounter: Payer: Self-pay | Admitting: Physician Assistant

## 2014-07-31 ENCOUNTER — Ambulatory Visit (INDEPENDENT_AMBULATORY_CARE_PROVIDER_SITE_OTHER): Payer: Managed Care, Other (non HMO) | Admitting: Physician Assistant

## 2014-07-31 VITALS — BP 122/84 | HR 114 | Temp 98.4°F | Resp 16 | Ht 68.0 in | Wt 235.2 lb

## 2014-07-31 DIAGNOSIS — Z13220 Encounter for screening for lipoid disorders: Secondary | ICD-10-CM

## 2014-07-31 DIAGNOSIS — Z1389 Encounter for screening for other disorder: Secondary | ICD-10-CM

## 2014-07-31 DIAGNOSIS — Z0000 Encounter for general adult medical examination without abnormal findings: Secondary | ICD-10-CM | POA: Diagnosis not present

## 2014-07-31 DIAGNOSIS — R5383 Other fatigue: Secondary | ICD-10-CM | POA: Diagnosis not present

## 2014-07-31 DIAGNOSIS — Z716 Tobacco abuse counseling: Secondary | ICD-10-CM | POA: Diagnosis not present

## 2014-07-31 DIAGNOSIS — Z1331 Encounter for screening for depression: Secondary | ICD-10-CM

## 2014-07-31 DIAGNOSIS — Z833 Family history of diabetes mellitus: Secondary | ICD-10-CM | POA: Diagnosis not present

## 2014-07-31 LAB — TSH: TSH: 0.899 u[IU]/mL (ref 0.350–4.500)

## 2014-07-31 LAB — LIPID PANEL
Cholesterol: 186 mg/dL (ref 0–200)
HDL: 33 mg/dL — AB (ref >=40)
LDL Cholesterol: 109 mg/dL — ABNORMAL HIGH (ref 0–99)
TRIGLYCERIDES: 220 mg/dL — AB (ref <150)
Total CHOL/HDL Ratio: 5.6 Ratio
VLDL: 44 mg/dL — AB (ref 0–40)

## 2014-07-31 MED ORDER — BUPROPION HCL ER (SR) 150 MG PO TB12
150.0000 mg | ORAL_TABLET | Freq: Two times a day (BID) | ORAL | Status: DC
Start: 2014-07-31 — End: 2015-04-23

## 2014-07-31 NOTE — Progress Notes (Signed)
[facility] [address], [place] [address] [phone]  Date:  07/31/2014   Name:  Douglas Vazquez   DOB:  1980-10-11   MRN:  [id number]  PCP:  No PCP Per Patient    Chief Complaint: Annual Exam   History of Present Illness:  This is a 34 y.o. male with PMH allergic rhinitis, HTN and psoriasis who is presenting for CPE.  I saw this pt for the first time 1 month ago for sinusitis. He had high blood pressure at that encounter and admitted it had been high for a while. He had never taken anything for it. He was started on hctz 12.5 mg. states this medication has worked well for him. He has noticed that he urinates more during the day but does not get up to urinate at night. He denies headache, dizziness, chest pain, palpitations, lower extremity edema.  Tobacco abuse: has cut down to 1 ppd from 1.5 ppd. Patient states he smokes out of habit. He does not feel he smokes for stress relief although if he is stressed he notes he smokes more. He has never tried anything for smoking cessation although he has heard bad things about Chantix. He wants to quit and is willing to try Wellbutrin today.   Allergic rhinitis: Last visit he was treated for sinusitis with Levaquin since he failed Augmentin. Symptoms were likely triggered by allergic rhinitis and he was advised to take Zyrtec and Flonase daily. He states his symptoms are much improved although he does still have intermittent nasal congestion. Patient notes he continues to feel fatigued even though his symptoms have improved. He notes he snores at night. His wife has told him before. She has not mentioned witnessing any apneic episodes. Pt states mood is good. Generally very happy.   Pt noting his sex drive is low. This started 1 year ago. He is wanting his testosterone checked. He states his brother is treated for low testosterone.  Patient has never seen a dermatologist for psoriasis. He wants to continue putting this  off until he has more of his health under control.   Immunizations: tdap 2011 Dentist: going for cleaning in 1 month Eye: Due to appointment. Diet: trying to eat healthier. He has cut down to 1 soda a day. Trying to cook at home more and not eat out as much. Exercise: he is having a hard time finding time to exercise. States [place] a busy road and cannot go for walks outside unless he drives somewhere. He is wanting to get a treadmill machine for the house. Fam hx: CVA and MI on dad's side. Mom with breast cancer. Uncle with unknown cancer. Dad had MI from unknown cause at age 62. Sexual hx: no concern for STDs today. Last tested 1.5 years ago. Pt is married.   Review of Systems:  Review of Systems  Constitutional: Positive for fatigue.  HENT: Negative.   Eyes: Negative.   Respiratory: Negative.   Cardiovascular: Negative.   Gastrointestinal: Positive for diarrhea. Negative for nausea and vomiting.  Endocrine: Negative.   Genitourinary: Negative.   Musculoskeletal: Positive for myalgias and back pain.  Skin: Negative.   Allergic/Immunologic: Positive for environmental allergies.  Neurological: Negative.   Hematological: Negative.   Psychiatric/Behavioral: Negative.     Patient Active Problem List   Diagnosis Date Noted  . Rhinitis, allergic 06/28/2014  . Psoriasis 06/28/2014  . Essential hypertension 06/28/2014    Prior to Admission medications   Medication  Sig Start Date End Date Taking? Authorizing Provider  cetirizine (ZYRTEC) 10 MG tablet Take 1 tablet (10 mg total) by mouth daily. 06/28/14  Yes Lanier Clam V, PA-C  fluticasone (FLONASE) 50 MCG/ACT nasal spray Place 2 sprays into both nostrils daily. 06/28/14  Yes Lanier Clam V, PA-C  hydrochlorothiazide (HYDRODIURIL) 12.5 MG tablet Take 1 tablet (12.5 mg total) by mouth daily. 06/28/14  Yes Lanier Clam V, PA-C           Allergies  Allergen Reactions  . Doxycycline Rash    Past Surgical History  Procedure  Laterality Date  . Tendon repair Right     R index finger tendon repair     History  Substance Use Topics  . Smoking status: Current Every Day Smoker -- 0.50 packs/day  . Smokeless tobacco: Not on file  . Alcohol Use: No    Family History  Problem Relation Age of Onset  . Heart attack Father   . Stroke Father   . Hypertension Father   . Diabetes Father   . Heart attack Paternal Uncle   . Anxiety disorder Mother   . Cancer Mother     breast  . Dementia Paternal Grandmother     Medication list has been reviewed and updated.  Physical Examination:  Physical Exam  Constitutional: He is oriented to person, place, and time. He appears well-developed and well-nourished. No distress.  HENT:  Head: Normocephalic and atraumatic.  Right Ear: Hearing, tympanic membrane, external ear and ear canal normal.  Left Ear: Hearing, tympanic membrane, external ear and ear canal normal.  Nose: Mucosal edema present.  Mouth/Throat: Uvula is midline, oropharynx is clear and moist and mucous membranes are normal.  Eyes: Conjunctivae, EOM and lids are normal. Pupils are equal, round, and reactive to light. Right eye exhibits no discharge. Left eye exhibits no discharge. No scleral icterus.  Neck: Trachea normal and normal range of motion. Neck supple. Carotid bruit is not present. No thyromegaly present.  Cardiovascular: Normal rate, regular rhythm, normal heart sounds, intact distal pulses and normal pulses.   No murmur heard. Pulmonary/Chest: Effort normal and breath sounds normal. No respiratory distress. He has no wheezes. He has no rhonchi. He has no rales.  Abdominal: Soft. Normal appearance and bowel sounds are normal. He exhibits no abdominal bruit. There is no tenderness.  Musculoskeletal: Normal range of motion. He exhibits no edema or tenderness.  Lymphadenopathy:       Head (right side): No submental, no submandibular, no tonsillar, no preauricular, no posterior auricular and no  occipital adenopathy present.       Head (left side): No submental, no submandibular, no tonsillar, no preauricular, no posterior auricular and no occipital adenopathy present.    He has no cervical adenopathy.  Neurological: He is alert and oriented to person, place, and time. He has normal strength and normal reflexes. No cranial nerve deficit or sensory deficit. Coordination and gait normal.  Skin: Skin is warm, dry and intact.  Dime size patch dry scaling skin on left elbow Scaling on bilateral eyebrows   Psychiatric: He has a normal mood and affect. His speech is normal and behavior is normal. Judgment and thought content normal.   BP 122/84 mmHg  Pulse 114  Temp(Src) 98.4 F (36.9 C) (Oral)  Resp 16  Ht  (1.727 m)  Wt 235 lb 3.2 oz (106.686 kg)  BMI 35.77 kg/m2  SpO2 96%  Assessment and Plan:  1. Depression screen Negative. Pt states mood  is good.  2. Other fatigue TSH and testosterone pending. May need to consider sleep study in the future. - TSH - Testosterone  3. Annual physical exam Continue cutting down on processed foods, sweets and soda. Try to exercise 4 days a week. - CBC with Differential/Platelet  4. Family history of diabetes mellitus - Hemoglobin A1c  5. Lipid screening - Lipid panel  6. Tobacco abuse counseling Pt is ready to quit smoking. He is going to start wellbutrin after his upcoming beach trip. We discussed side effects. Goal of complete smoking cessation at week 2 of therapy. Return in 8 weeks for follow up. - buPROPion (WELLBUTRIN SR) 150 MG 12 hr tablet; Take 1 tablet (150 mg total) by mouth 2 (two) times daily.  Dispense: 60 tablet; Refill: 0   Roswell MinersNicole V. Dyke BrackettBush, PA-C, MHS [facility]  08/04/2014

## 2014-07-31 NOTE — Patient Instructions (Signed)
Take 1 tab wellbutrin x 3 days, then twice a day thereafter. Target goal of complete smoking cessation by week 2 of therapy. Do not take 2nd dose of wellbutrin later than 6 pm. Suck on sugar free candy or gum to help when you have a craving. Continue BP med. I will call you with results of your lab tests. We will discuss referral to dermatology at a later date. Pay attention to your fatigue - may need to consider sleep study later on. Continue to cut down on processed foods, sweets and soda.  Return in 6 weeks for follow up.

## 2014-08-01 LAB — CBC WITH DIFFERENTIAL/PLATELET
BASOS ABS: 0 10*3/uL (ref 0.0–0.1)
Basophils Relative: 0 % (ref 0–1)
Eosinophils Absolute: 0.4 10*3/uL (ref 0.0–0.7)
Eosinophils Relative: 4 % (ref 0–5)
HCT: 49.8 % (ref 39.0–52.0)
HEMOGLOBIN: 17.8 g/dL — AB (ref 13.0–17.0)
Lymphocytes Relative: 19 % (ref 12–46)
Lymphs Abs: 2 10*3/uL (ref 0.7–4.0)
MCH: 29.2 pg (ref 26.0–34.0)
MCHC: 35.7 g/dL (ref 30.0–36.0)
MCV: 81.8 fL (ref 78.0–100.0)
MONO ABS: 0.9 10*3/uL (ref 0.1–1.0)
MPV: 10.5 fL (ref 8.6–12.4)
Monocytes Relative: 8 % (ref 3–12)
NEUTROS ABS: 7.4 10*3/uL (ref 1.7–7.7)
Neutrophils Relative %: 69 % (ref 43–77)
PLATELETS: 254 10*3/uL (ref 150–400)
RBC: 6.09 MIL/uL — ABNORMAL HIGH (ref 4.22–5.81)
RDW: 13.6 % (ref 11.5–15.5)
WBC: 10.7 10*3/uL — AB (ref 4.0–10.5)

## 2014-08-01 LAB — HEMOGLOBIN A1C
HEMOGLOBIN A1C: 5.6 % (ref <5.7)
MEAN PLASMA GLUCOSE: 114 mg/dL (ref <117)

## 2014-08-01 LAB — TESTOSTERONE: Testosterone: 276 ng/dL — ABNORMAL LOW (ref 300–890)

## 2014-08-02 ENCOUNTER — Telehealth: Payer: Self-pay | Admitting: Radiology

## 2014-08-02 DIAGNOSIS — R5383 Other fatigue: Secondary | ICD-10-CM

## 2014-08-02 NOTE — Telephone Encounter (Signed)
Pt is calling about lab results  

## 2014-08-08 NOTE — Telephone Encounter (Signed)
Pt calling again for labs. Please review. Thanks

## 2014-08-08 NOTE — Telephone Encounter (Signed)
Spoke with pt about results. Advised exercise, diet low in fats and adding fish oil pill to regimen to lower triglycerides and raise HDL. Low testosterone at last exam. This was taken during the afternoon. He will return for lab only testosterone in the morning. Future orders in.

## 2014-09-25 ENCOUNTER — Ambulatory Visit: Payer: Managed Care, Other (non HMO) | Admitting: Physician Assistant

## 2014-10-10 ENCOUNTER — Telehealth: Payer: Self-pay | Admitting: Physician Assistant

## 2014-10-10 ENCOUNTER — Other Ambulatory Visit: Payer: Self-pay | Admitting: Physician Assistant

## 2014-10-10 DIAGNOSIS — R21 Rash and other nonspecific skin eruption: Secondary | ICD-10-CM

## 2014-10-10 NOTE — Telephone Encounter (Signed)
Placing referral now.  Deliah Boston, MS, PA-C   8:22 PM, 10/10/2014

## 2014-10-10 NOTE — Telephone Encounter (Signed)
Patient called and states that he and Lanier Clam discussed setting him up with a dermatologist. He states that he was instructed to call our office when he was ready to schedule an appointment for that.  [phone]

## 2014-10-11 NOTE — Telephone Encounter (Signed)
Pt.notified

## 2014-11-28 ENCOUNTER — Telehealth: Payer: Self-pay | Admitting: Family Medicine

## 2014-11-28 NOTE — Telephone Encounter (Signed)
lmom to call and schedule an OV with [employer]

## 2015-04-23 ENCOUNTER — Encounter: Payer: Self-pay | Admitting: *Deleted

## 2015-04-23 ENCOUNTER — Emergency Department
Admission: EM | Admit: 2015-04-23 | Discharge: 2015-04-23 | Disposition: A | Discharge status: Home / Self Care | Payer: Managed Care, Other (non HMO) | Source: Home / Self Care | Attending: Family Medicine | Admitting: Family Medicine

## 2015-04-23 DIAGNOSIS — Z20828 Contact with and (suspected) exposure to other viral communicable diseases: Secondary | ICD-10-CM

## 2015-04-23 DIAGNOSIS — R6889 Other general symptoms and signs: Secondary | ICD-10-CM

## 2015-04-23 DIAGNOSIS — I10 Essential (primary) hypertension: Secondary | ICD-10-CM

## 2015-04-23 HISTORY — DX: Essential (primary) hypertension: I10

## 2015-04-23 MED ORDER — BENZONATATE 100 MG PO CAPS: 100.0000 mg | ORAL_CAPSULE | Freq: Three times a day (TID) | ORAL | Status: DC

## 2015-04-23 MED ORDER — OSELTAMIVIR PHOSPHATE 75 MG PO CAPS: 75.0000 mg | ORAL_CAPSULE | Freq: Two times a day (BID) | ORAL | Status: DC

## 2015-04-23 MED ORDER — DEXAMETHASONE SODIUM PHOSPHATE 10 MG/ML IJ SOLN
10.0000 mg | Freq: Once | INTRAMUSCULAR | Status: AC
  Administered 2015-04-23: 10 mg via INTRAMUSCULAR

## 2015-04-23 NOTE — ED Provider Notes (Signed)
CSN: [id number]     Arrival date & time 04/23/15  1703 History   First MD Initiated Contact with Patient 04/23/15 1715     Chief Complaint  Patient presents with  . Generalized Body Aches  . Sore Throat   (Consider location/radiation/quality/duration/timing/severity/associated sxs/prior Treatment) HPI  The pt is a 35yo male presenting to [facility] with c/o flu-like symptoms including subjective fever, chills, body aches, generalized headache and sore throat that started 2 days ago. His son tested positive for the flu. Pt is here with his wife who has similar symptoms.  He did not get the flu vaccine this year.  He has been taking ibuprofen, last dose 1 hour ago, only minimal relief.  Pain all over is 10/10 aching and sore. He reports hx of diarrhea but notes he has had that intermittently since before the flu and is already scheduled for a colonoscopy and hopes to be better before then as he cannot have a fever on day of that procedure. Denies nausea or vomiting. Denies chest pain. Denies hx of asthma.   BP elevated in triage,159/104. Hx of HTN.   Past Medical History  Diagnosis Date  . Non-cardiac chest pain   . Smoker   . Obese   . Herniated disc   . Anxiety   . Allergy   . Hypertension    Past Surgical History  Procedure Laterality Date  . Tendon repair Right     R index finger tendon repair    Family History  Problem Relation Age of Onset  . Heart attack Father   . Stroke Father   . Hypertension Father   . Diabetes Father   . Heart attack Paternal Uncle   . Anxiety disorder Mother   . Cancer Mother     breast  . Dementia Paternal Grandmother    Social History  Substance Use Topics  . Smoking status: Current Every Day Smoker -- 0.50 packs/day  . Smokeless tobacco: None  . Alcohol Use: No    Review of Systems  Constitutional: Positive for fever, chills and fatigue.  HENT: Positive for congestion, rhinorrhea and sore throat. Negative for ear pain, trouble swallowing and  voice change.   Respiratory: Positive for cough and shortness of breath.   Cardiovascular: Negative for chest pain and palpitations.  Gastrointestinal: Negative for nausea, vomiting, abdominal pain and diarrhea.  Musculoskeletal: Positive for myalgias and arthralgias. Negative for back pain.  Skin: Negative for rash.  Neurological: Positive for headaches. Negative for dizziness and light-headedness.    Allergies  Doxycycline  Home Medications   Prior to Admission medications   Medication Sig Start Date End Date Taking? Authorizing Provider  benzonatate (TESSALON) 100 MG capsule Take 1 capsule (100 mg total) by mouth every 8 (eight) hours. 04/23/15   Junius Finner, PA-C  oseltamivir (TAMIFLU) 75 MG capsule Take 1 capsule (75 mg total) by mouth every 12 (twelve) hours. 04/23/15   Junius Finner, PA-C   Meds Ordered and Administered this Visit   Medications  dexamethasone (DECADRON) injection 10 mg (10 mg Intramuscular Given 04/23/15 1738)    BP 159/104 mmHg  Pulse 96  Temp(Src) 98.8 F (37.1 C) (Oral)  Resp 16  Ht  (1.727 m)  Wt 242 lb (109.77 kg)  BMI 36.80 kg/m2  SpO2 97% No data found.   Physical Exam  Constitutional: He is oriented to person, place, and time. He appears well-developed and well-nourished.  HENT:  Head: Normocephalic and atraumatic.  Right Ear: Tympanic membrane normal.  Left Ear: Tympanic membrane normal.  Nose: Rhinorrhea present.  Mouth/Throat: Uvula is midline and mucous membranes are normal. Posterior oropharyngeal erythema present. No oropharyngeal exudate, posterior oropharyngeal edema or tonsillar abscesses.  Eyes: Conjunctivae and EOM are normal. Pupils are equal, round, and reactive to light. No scleral icterus.  Neck: Normal range of motion. Neck supple.  No meningeal signs.  Cardiovascular: Normal rate, regular rhythm and normal heart sounds.   Pulmonary/Chest: Effort normal and breath sounds normal. No stridor. No respiratory distress.  He has no wheezes. He has no rales. He exhibits no tenderness.  Abdominal: Soft. He exhibits no distension. There is no tenderness.  Musculoskeletal: Normal range of motion.  Lymphadenopathy:    He has no cervical adenopathy.  Neurological: He is alert and oriented to person, place, and time.  Skin: Skin is warm and dry.  Nursing note and vitals reviewed.   ED Course  Procedures (including critical care time)  Labs Review Labs Reviewed - No data to display  Imaging Review No results found.    MDM   1. Flu-like symptoms   2. Exposure to the flu   3. Essential hypertension, benign    Pt c/o flu-like symptoms for 2 days, exposure to flu.  Pt would like to be treated with tamiflu and for headache.  Tx in UC: Decadron  IM, no toradol given as pt had ibuprofen 1 hour PTA  Rx: Tamiflu and tessalon  Advised pt to use acetaminophen and ibuprofen as needed for fever and pain. Encouraged rest and fluids. F/u with PCP in 7-10 days if not improving, sooner if worsening. Pt verbalized understanding and agreement with tx plan.     Junius Finner, PA-C 04/23/15 [phone]

## 2015-04-23 NOTE — ED Notes (Signed)
Pt c/o body aches, HA, sore throat, minimal cough since Sunday Night. No flu vaccine this season. Son + for Influenza.

## 2015-04-23 NOTE — Discharge Instructions (Signed)

## 2015-06-25 DIAGNOSIS — E669 Obesity, unspecified: Secondary | ICD-10-CM

## 2015-06-25 HISTORY — DX: Obesity, unspecified: E66.9

## 2015-09-30 DIAGNOSIS — G4733 Obstructive sleep apnea (adult) (pediatric): Secondary | ICD-10-CM

## 2015-09-30 HISTORY — DX: Obstructive sleep apnea (adult) (pediatric): G47.33

## 2017-03-08 DIAGNOSIS — R200 Anesthesia of skin: Secondary | ICD-10-CM | POA: Insufficient documentation

## 2017-03-08 DIAGNOSIS — R5383 Other fatigue: Secondary | ICD-10-CM

## 2017-03-08 DIAGNOSIS — H938X3 Other specified disorders of ear, bilateral: Secondary | ICD-10-CM

## 2017-03-08 DIAGNOSIS — R202 Paresthesia of skin: Secondary | ICD-10-CM

## 2017-03-08 DIAGNOSIS — G4700 Insomnia, unspecified: Secondary | ICD-10-CM

## 2017-03-08 HISTORY — DX: Other specified disorders of ear, bilateral: H93.8X3

## 2017-03-08 HISTORY — DX: Anesthesia of skin: R20.0

## 2017-03-08 HISTORY — DX: Other fatigue: R53.83

## 2017-03-08 HISTORY — DX: Insomnia, unspecified: G47.00

## 2017-04-09 DIAGNOSIS — Z720 Tobacco use: Secondary | ICD-10-CM

## 2017-04-09 HISTORY — DX: Tobacco use: Z72.0

## 2017-05-03 ENCOUNTER — Emergency Department (HOSPITAL_COMMUNITY): Payer: Managed Care, Other (non HMO)

## 2017-05-03 ENCOUNTER — Encounter (HOSPITAL_COMMUNITY): Payer: Self-pay | Admitting: Emergency Medicine

## 2017-05-03 ENCOUNTER — Emergency Department (HOSPITAL_COMMUNITY)
Admission: EM | Admit: 2017-05-03 | Discharge: 2017-05-04 | Disposition: A | Discharge status: Home / Self Care | Payer: Managed Care, Other (non HMO) | Attending: Emergency Medicine | Admitting: Emergency Medicine

## 2017-05-03 DIAGNOSIS — R072 Precordial pain: Secondary | ICD-10-CM

## 2017-05-03 DIAGNOSIS — I10 Essential (primary) hypertension: Secondary | ICD-10-CM | POA: Diagnosis not present

## 2017-05-03 DIAGNOSIS — F17210 Nicotine dependence, cigarettes, uncomplicated: Secondary | ICD-10-CM | POA: Diagnosis not present

## 2017-05-03 DIAGNOSIS — R079 Chest pain, unspecified: Secondary | ICD-10-CM | POA: Diagnosis present

## 2017-05-03 LAB — BASIC METABOLIC PANEL
ANION GAP: 10 (ref 5–15)
BUN: 10 mg/dL (ref 6–20)
CHLORIDE: 106 mmol/L (ref 101–111)
CO2: 24 mmol/L (ref 22–32)
Calcium: 9.1 mg/dL (ref 8.9–10.3)
Creatinine, Ser: 1.02 mg/dL (ref 0.61–1.24)
GFR calc Af Amer: >60 mL/min (ref >60)
GLUCOSE: 121 mg/dL — AB (ref 65–99)
POTASSIUM: 3.2 mmol/L — AB (ref 3.5–5.1)
Sodium: 140 mmol/L (ref 135–145)

## 2017-05-03 LAB — CBC
HEMATOCRIT: 44.7 % (ref 39.0–52.0)
HEMOGLOBIN: 15.8 g/dL (ref 13.0–17.0)
MCH: 29.8 pg (ref 26.0–34.0)
MCHC: 35.3 g/dL (ref 30.0–36.0)
MCV: 84.3 fL (ref 78.0–100.0)
Platelets: 236 10*3/uL (ref 150–400)
RBC: 5.3 MIL/uL (ref 4.22–5.81)
RDW: 13.4 % (ref 11.5–15.5)
WBC: 12.5 10*3/uL — ABNORMAL HIGH (ref 4.0–10.5)

## 2017-05-03 LAB — PROTIME-INR
INR: 0.97
Prothrombin Time: 12.8 seconds (ref 11.4–15.2)

## 2017-05-03 LAB — D-DIMER, QUANTITATIVE: D-Dimer, Quant: <0.27 ug/mL-FEU (ref 0.00–0.50)

## 2017-05-03 LAB — I-STAT TROPONIN, ED: Troponin i, poc: 0 ng/mL (ref 0.00–0.08)

## 2017-05-03 MED ORDER — ASPIRIN 81 MG PO CHEW
324.0000 mg | CHEWABLE_TABLET | Freq: Once | ORAL | Status: AC
  Administered 2017-05-03: 324 mg via ORAL
  Filled 2017-05-03: qty 4

## 2017-05-03 NOTE — ED Triage Notes (Signed)
Patient arrived with EMS from home reports intermittent central chest pain radiating to left upper back onset last night , mild SOB , no nausea or diaphoresis . Hypertensive this evening 160100 .

## 2017-05-03 NOTE — ED Provider Notes (Signed)
[facility] EMERGENCY DEPARTMENT Provider Note   CSN: [id number] Arrival date & time: 05/03/17  2257     History   Chief Complaint Chief Complaint  Patient presents with  . Chest Pain  . Hypertension    HPI Douglas Vazquez is a 37 y.o. male.  HPI  This is a 37 year old male with a history of hypertension who presents with chest pain.  Patient reports intermittent chest pain since last night.  He states he has had multiple episodes of dull chest pain under his left breast that has lasted less than 1 minute.  He has mostly been resting during these episodes.  However, tonight episode persisted for longer than 1 minute.  He had some associated shortness of breath.  Occasionally chest pain does radiate to his shoulder blade.  Denies any recent coughs.  Is currently taking amoxicillin for strep throat.  Denies any leg swelling, history of blood clots, recent long travel.  Positive family history with a father had his initial heart attack in his 30s.  He is also a smoker.  Past Medical History:  Diagnosis Date  . Allergy   . Anxiety   . Herniated disc   . Hypertension   . Non-cardiac chest pain   . Obese   . Smoker     Patient Active Problem List   Diagnosis Date Noted  . Rhinitis, allergic 06/28/2014  . Psoriasis 06/28/2014  . Essential hypertension 06/28/2014    Past Surgical History:  Procedure Laterality Date  . TENDON REPAIR Right    R index finger tendon repair        Home Medications    Prior to Admission medications   Medication Sig Start Date End Date Taking? Authorizing Provider  benzonatate (TESSALON) 100 MG capsule Take 1 capsule (100 mg total) by mouth every 8 (eight) hours. 04/23/15   Lurene Shadow, PA-C  oseltamivir (TAMIFLU) 75 MG capsule Take 1 capsule (75 mg total) by mouth every 12 (twelve) hours. 04/23/15   Lurene Shadow, PA-C    Family History Family History  Problem Relation Age of Onset  . Heart attack Father   . Stroke  Father   . Hypertension Father   . Diabetes Father   . Heart attack Paternal Uncle   . Anxiety disorder Mother   . Cancer Mother        breast  . Dementia Paternal Grandmother     Social History Social History   Tobacco Use  . Smoking status: Current Every Day Smoker    Packs/day: 0.50  . Smokeless tobacco: Never Used  Substance Use Topics  . Alcohol use: No  . Drug use: No     Allergies   Doxycycline   Review of Systems Review of Systems  Constitutional: Negative for fever.  Respiratory: Positive for shortness of breath. Negative for cough.   Cardiovascular: Positive for chest pain. Negative for leg swelling.  Gastrointestinal: Negative for abdominal pain, diarrhea, nausea and vomiting.  Genitourinary: Negative for dysuria.  Neurological: Negative for headaches.  All other systems reviewed and are negative.    Physical Exam Updated Vital Signs BP 120/84   Pulse 95   Temp 98.9 F (37.2 C) (Oral)   Resp 20   Ht 5\' 8"  (1.727 m)   Wt 111.1 kg (245 lb)   SpO2 95%   BMI 37.25 kg/m   Physical Exam  Constitutional: He is oriented to person, place, and time. He appears well-developed and well-nourished. He does not  appear ill.  HENT:  Head: Normocephalic and atraumatic.  Eyes: Pupils are equal, round, and reactive to light.  Cardiovascular: Normal rate, regular rhythm and normal heart sounds.  No murmur heard. Pulmonary/Chest: Effort normal and breath sounds normal. No respiratory distress. He has no wheezes.  Abdominal: Soft. Bowel sounds are normal. There is no tenderness. There is no rebound.  Musculoskeletal: He exhibits no edema.       Right lower leg: He exhibits no edema.       Left lower leg: He exhibits no edema.  Lymphadenopathy:    He has no cervical adenopathy.  Neurological: He is alert and oriented to person, place, and time.  Skin: Skin is warm and dry.  Psychiatric: He has a normal mood and affect.  Nursing note and vitals  reviewed.    ED Treatments / Results  Labs (all labs ordered are listed, but only abnormal results are displayed) Labs Reviewed  BASIC METABOLIC PANEL - Abnormal; Notable for the following components:      Result Value   Potassium 3.2 (*)    Glucose, Bld 121 (*)    All other components within normal limits  CBC - Abnormal; Notable for the following components:   WBC 12.5 (*)    All other components within normal limits  PROTIME-INR  D-DIMER, QUANTITATIVE (NOT AT [facility])  I-STAT TROPONIN, ED  I-STAT TROPONIN, ED    EKG  EKG Interpretation  Date/Time:  Monday May 03 2017 23:07:15 EST Ventricular Rate:  104 PR Interval:    QRS Duration: 111 QT Interval:  336 QTC Calculation: 442 R Axis:   -10 Text Interpretation:  Sinus tachycardia Borderline T abnormalities, lateral leads No prior for comparison Confirmed by Ross MarcusHorton, Alveta Quintela ([id number]) on 05/04/2017 12:35:01 AM       Radiology Dg Chest 2 View  Result Date: 05/03/2017 CLINICAL DATA:  Chest pain and shortness of breath. EXAM: CHEST  2 VIEW COMPARISON:  None. FINDINGS: Low lung volumes on the PA view. There is bronchial thickening. The cardiomediastinal contours are normal. Pulmonary vasculature is normal. No consolidation, pleural effusion, or pneumothorax. No acute osseous abnormalities are seen. IMPRESSION: Bronchial thickening which can be seen with bronchitis, asthma, or smoking related lung disease. Electronically Signed   By: Rubye OaksMelanie  Ehinger M.D.   On: 05/03/2017 23:40    Procedures Procedures (including critical care time)  Medications Ordered in ED Medications  aspirin chewable tablet 324 mg (324 mg Oral Given 05/03/17 2342)     Initial Impression / Assessment and Plan / ED Course  I have reviewed the triage vital signs and the nursing notes.  Pertinent labs & imaging results that were available during my care of the patient were reviewed by me and considered in my medical decision making (see chart for  details).     Patient presents with chest pain.  Intermittent.  Nonexertional.  Vital signs are largely reassuring.  He is nontoxic appearing and physical exam is benign.  His heart score is 3.  He is currently chest pain-free.  Initial EKG is reassuring without signs of ischemia.  D-dimer is negative and patient is low risk for PE.  Chest x-ray shows no evidence of pneumonia.  He does have some bronchitic changes.  He reports a dry cough and he is a smoker.  Denies any infectious symptoms.  Repeat troponin remains negative.  Given his family history and risk factors, would recommend cardiology follow-up for stress testing.  In the meantime recommend anti-inflammatories.  After history,  exam, and medical workup I feel the patient has been appropriately medically screened and is safe for discharge home. Pertinent diagnoses were discussed with the patient. Patient was given return precautions.   Final Clinical Impressions(s) / ED Diagnoses   Final diagnoses:  Precordial pain    ED Discharge Orders    None       Finn Amos, Mayer Masker, MD 05/04/17 [phone]

## 2017-05-04 LAB — I-STAT TROPONIN, ED: Troponin i, poc: 0 ng/mL (ref 0.00–0.08)

## 2017-05-04 NOTE — Discharge Instructions (Signed)
You were seen today for chest pain.  Your workup is reassuring.  Given your risk factors, you should have stress testing to rule out cardiac causes.  If you have any new or worsening symptoms you should be reevaluated.

## 2017-05-12 NOTE — Progress Notes (Signed)
Cardiology Office Note:    Date:  05/13/2017   ID:  Douglas Vazquez, DOB August 05, 1980, MRN [id number]  PCP:  Bryon Lions, PA-C  Cardiologist:  Norman Herrlich, MD   Referring MD: Bryon Lions, PA-C  ASSESSMENT:    1. Chest pain in adult   2. Essential hypertension    PLAN:    In order of problems listed above:  1. His symptoms are best described as anginal and despite his age he is at intermediate risk of coronary disease with cigarette smoking low HDL and family history of premature CAD.  His EKG remains normal I asked him to take aspirin 81 mg daily continue his calcium channel blocker and for further evaluation will have a cardiac CTA.  I think is a good test to answer the question of whether or not he has coronary disease but also to look at other structures including his thoracic aorta as he tells me he has initial blood pressure was 260/120 in the ambulance but surprisingly normalized without drug therapy. 2. Stable I have added a low-dose of a distal diuretic for better BP control continue his calcium channel blocker.  Next appointment 2 weeks   Medication Adjustments/Labs and Tests Ordered: Current medicines are reviewed at length with the patient today.  Concerns regarding medicines are outlined above.  Orders Placed This Encounter  Procedures  . CT CORONARY MORPH W/CTA COR W/SCORE W/CA W/CM &/OR WO/CM  . CT CORONARY FRACTIONAL FLOW RESERVE DATA PREP  . CT CORONARY FRACTIONAL FLOW RESERVE FLUID ANALYSIS  . NMR, lipoprofile   Meds ordered this encounter  Medications  . eplerenone (INSPRA) 50 MG tablet    Sig: Take 1 tablet (50 mg total) by mouth daily.    Dispense:  30 tablet    Refill:  5     Chief Complaint  Patient presents with  . New Patient (Initial Visit)    to evaluate recent episodes of CP  3 weeks  History of Present Illness:    Douglas Vazquez is a 37 y.o. male with hypertension, low HDL 33 in 2016, smoker and FH of CAD who is being seen  today for the evaluation of chest pain after [facility] ED visit 05/03/17 at the request of Horton, Mayer Masker, MD .  Initial Impression / Assessment and Plan / ED Course 05/03/17:   Pertinent labs & imaging results that were available during my care of the patient were reviewed by me and considered in my medical decision making (see chart for details). Patient presents with chest pain.  Intermittent.  Nonexertional.  Vital signs are largely reassuring.  He is nontoxic appearing and physical exam is benign.  His heart score is 3.  He is currently chest pain-free.  Initial EKG is reassuring without signs of ischemia.  D-dimer is negative and patient is low risk for PE.  Chest x-ray shows no evidence of pneumonia.  He does have some bronchitic changes.  He reports a dry cough and he is a smoker.  Denies any infectious symptoms.  Repeat troponin remains negative.  Given his family history and risk factors, would recommend cardiology follow-up for stress testing.  In the meantime recommend anti-inflammatories. After history, exam, and medical workup I feel the patient has been appropriately medically screened and is safe for discharge home. Pertinent diagnoses were discussed with the patient. Patient was given return precautions. Final Clinical Impressions(s) / ED Diagnoses   Final diagnoses:  Precordial pain  The day before his ED visit he  had some vague waxing and waning of left Precordial discomfort he described as an ache it was momentary it was nonexertional unrelieved with rest and no associated symptoms.  The day he went to the emergency room it occurred without activity was severe in nature radiated through to his shoulder and his back he was breathless diaphoretic and he has such as a sense of impending doom that he went home rather than driving to the hospital and asked his wife to call an ambulance.  He tells me his initial blood pressure in the range of 260/120 but is normalized in the ED without being  given medications he said his chest pain pretty much was over by the time he got to the hospital and it is not recurred.  He has had no chest trauma cough fever and no associated GI symptoms.  His risk factors noteworthy for cigarette smoking he is in the process of cessation family history of father MI at age 77 and low HDL.  After discussion of options of noninvasive modalities he will undergo cardiac CTA to evaluate for CAD and also to get a good look at his thoracic aorta.  For further evaluation of lipid abnormality I asked him to have an advanced lipid profile performed particularly to look at able protein a often found in the circumstances familial with marked premature CAD   Past Medical History:  Diagnosis Date  . Allergy   . Anxiety   . Ear congestion, bilateral 03/08/2017   Last Assessment & Plan:  Patient notes recurrent infections in the past. States he has a congestion/fluid-like feeling in the [place] today. States that his been going on for a couple weeks. Notes that it is concurrent with increase in fatigue. Denies any true pain. Denies any fever. No recent upper respiratory infection symptoms. Physical exam does not reveal any signs of infection or fluid behind   . Essential hypertension 06/28/2014  . Fatigue 03/08/2017   Last Assessment & Plan:  Patient notices new onset of fatigue 4 weeks ago. He states that there is no changes in his routine. He denies any changes in medication or diet. No evidence of active bleeding. Does note that he is noncompliant with his CPAP machine. Advised that we would do blood work today. Patient refused and states that he would like to repeat in 4 weeks as he has not had anything to   . Herniated disc   . Hypertension   . Insomnia 03/08/2017   Last Assessment & Plan:  Patient notes some difficulty sleeping when he is trying to wear his CPAP machine. He states that also he has a little bit of restlessness at night. Is inquiring for a sleep aid to help him with  these symptoms. Has tried melatonin in the past. Will prescribe Rozerem 8 mg tablets as a starting point to help with difficulty sleeping, in order to try and avoid increasing fati  . Non-cardiac chest pain   . Numbness and tingling in right hand 03/08/2017   Last Assessment & Plan:  Occurs mostly at night. Noticing symptoms are worsening. Notices some tingling and numbness in the right hand. Has not used any treatments thus far. Advised to use a wrist brace at night to help with likely carpal tunnel-like symptoms. Advised to follow up in 4 weeks.  . Obese   . Obesity (BMI 35.0-39.9 without comorbidity) 06/25/2015  . OSA (obstructive sleep apnea) 09/30/2015   Overview:  08/2015  HSAT  AHI 21.4  Sat 80 %  Average Sat 92%  Last Assessment & Plan:  Patient previously diagnosed. Noncompliant with CPAP at this time. Expereincing increased fatigue. Notes excessive daytime tiredness. States that he has difficulty sleeping with CPAP mask. Is recommended to wear the full mask as he sleeps with his mouth open. Has not been back to see a sleep doctor recently. [place]  . Psoriasis 06/28/2014   Under care of dermatologist, Dr Andrey Campanile   . Rhinitis, allergic 06/28/2014  . Smoker   . Tobacco use 04/09/2017   Last Assessment & Plan:  Smoking cessation desired. Previously failed chantix due to irritability. > 1PPD. 21 pack-year history. Will start bupropion 150mg  for 3 days and increase to 300mg . Patient reports readiness for quitting. Offered talk therapy referral. 1800 QUIT LINE as well. Patient will try medications. Advised to pick a quit date, use other healthy habits to replace oral fixation during    Past Surgical History:  Procedure Laterality Date  . TENDON REPAIR Right    R index finger tendon repair     Current Medications: Current Meds  Medication Sig  . amLODipine (NORVASC) 5 MG tablet Take 10 mg by mouth daily.  Marland Kitchen buPROPion (WELLBUTRIN SR) 150 MG 12 hr tablet Take 150 mg by mouth 2 (two) times daily.  .  fluocinonide ointment (LIDEX) 0.05 % Apply topically daily.  Marland Kitchen ibuprofen (ADVIL,MOTRIN) 200 MG tablet Take 200 mg by mouth daily as needed for migraine.     Allergies:   Doxycycline   Social History   Socioeconomic History  . Marital status: Married    Spouse name: None  . Number of children: None  . Years of education: None  . Highest education level: None  Social Needs  . Financial resource strain: None  . Food insecurity - worry: None  . Food insecurity - inability: None  . Transportation needs - medical: None  . Transportation needs - non-medical: None  Occupational History  . Occupation: maintenance supervisor  Tobacco Use  . Smoking status: Current Every Day Smoker    Packs/day: 0.50  . Smokeless tobacco: Never Used  Substance and Sexual Activity  . Alcohol use: No  . Drug use: No  . Sexual activity: None  Other Topics Concern  . None  Social History Narrative   Exercise weights 3 times/week for 30-45 minutes     Family History: The patient's family history includes Anxiety disorder in his mother; Cancer in his mother; Dementia in his paternal grandmother; Diabetes in his father; Heart attack in his father and paternal uncle; Hypertension in his father; Stroke in his father.  ROS:   Review of Systems  Constitution: Positive for diaphoresis (with chest pain).  HENT: Negative.   Eyes: Negative.   Cardiovascular: Positive for chest pain.  Respiratory: Positive for shortness of breath (with chest pain).   Endocrine: Negative.   Hematologic/Lymphatic: Negative.   Skin: Negative.   Musculoskeletal: Negative.   Gastrointestinal: Negative.   Genitourinary: Negative.   Neurological: Negative.   Psychiatric/Behavioral: Negative.   Allergic/Immunologic: Negative.    Please see the history of present illness.     All other systems reviewed and are negative.  EKGs/Labs/Other Studies Reviewed:    The following studies were reviewed today:   EKG:  EKG is  ordered  today.  The ekg ordered today demonstrates [facility] and is normal       EKG Interpretation  Date/Time:  Monday May 03 2017 23:07:15 EST Ventricular Rate:   104 PR Interval:                        QRS Duration:        111 QT Interval:                      336 QTC Calculation:    442 R Axis:                         -10 Text Interpretation:  Sinus tachycardia Borderline T abnormalities, lateral leads No prior for comparison Confirmed by Ross MarcusHorton, Courtney ([id number]) on 05/04/2017 12:35:01 AM     Recent Labs: 05/03/2017: BUN 10; Creatinine, Ser 1.02; Hemoglobin 15.8; Platelets 236; Potassium 3.2; Sodium 140  Recent Lipid Panel    Component Value Date/Time   CHOL 186 07/31/2014 1546   TRIG 220 (H) 07/31/2014 1546   HDL 33 (L) 07/31/2014 1546   CHOLHDL 5.6 07/31/2014 1546   VLDL 44 (H) 07/31/2014 1546   LDLCALC 109 (H) 07/31/2014 1546    Physical Exam:    VS:  BP (!) 132/98 (BP Location: Right Arm, Patient Position: Sitting, Cuff Size: Large)   Pulse 90   Ht 5\' 8"  (1.727 m)   Wt 241 lb 1.9 oz (109.4 kg)   SpO2 98%   BMI 36.66 kg/m     Wt Readings from Last 3 Encounters:  05/13/17 241 lb 1.9 oz (109.4 kg)  05/03/17 245 lb (111.1 kg)  04/23/15 242 lb (109.8 kg)     GEN:  Well nourished, well developed in no acute distress HEENT: Normal NECK: No JVD; No carotid bruits LYMPHATICS: No lymphadenopathy CARDIAC: RRR, no murmurs, rubs, gallops RESPIRATORY:  Clear to auscultation without rales, wheezing or rhonchi  ABDOMEN: Soft, non-tender, non-distended MUSCULOSKELETAL:  No edema; No deformity  SKIN: Warm and dry NEUROLOGIC:  Alert and oriented x 3 PSYCHIATRIC:  Normal affect     Signed, Norman HerrlichBrian Mackie Holness, MD  05/13/2017 1:52 PM    [facility]

## 2017-05-13 ENCOUNTER — Ambulatory Visit: Payer: Managed Care, Other (non HMO) | Admitting: Cardiology

## 2017-05-13 ENCOUNTER — Encounter: Payer: Self-pay | Admitting: Cardiology

## 2017-05-13 VITALS — BP 132/98 | HR 90 | Ht 68.0 in | Wt 241.1 lb

## 2017-05-13 DIAGNOSIS — I10 Essential (primary) hypertension: Secondary | ICD-10-CM

## 2017-05-13 DIAGNOSIS — R079 Chest pain, unspecified: Secondary | ICD-10-CM | POA: Diagnosis not present

## 2017-05-13 MED ORDER — METOPROLOL TARTRATE 50 MG PO TABS: 50.0000 mg | ORAL_TABLET | Freq: Once | ORAL | 0 refills | Status: DC

## 2017-05-13 MED ORDER — NITROGLYCERIN 0.4 MG SL SUBL: 0.4000 mg | SUBLINGUAL_TABLET | SUBLINGUAL | 11 refills | Status: AC | PRN

## 2017-05-13 MED ORDER — EPLERENONE 50 MG PO TABS: 50.0000 mg | ORAL_TABLET | Freq: Every day | ORAL | 3 refills | Status: DC

## 2017-05-13 MED ORDER — NITROGLYCERIN 0.4 MG SL SUBL: 0.4000 mg | SUBLINGUAL_TABLET | SUBLINGUAL | 11 refills | Status: DC | PRN

## 2017-05-13 MED ORDER — ASPIRIN EC 81 MG PO TBEC: 81.0000 mg | DELAYED_RELEASE_TABLET | Freq: Every day | ORAL | 3 refills | Status: DC

## 2017-05-13 MED ORDER — EPLERENONE 50 MG PO TABS: 50.0000 mg | ORAL_TABLET | Freq: Every day | ORAL | 5 refills | Status: DC

## 2017-05-13 NOTE — Patient Instructions (Addendum)
Medication Instructions:  Your physician has recommended you make the following change in your medication:  START aspirin 81 mg daily enteric coated START epleronone (Inspra) 25 mg daily START nitroglycerin 0.4 mg sublingual (under your tongue) every 5 minutes as needed for chest pain. When having chest pain, stop what you are doing and sit down. Take 1 nitro, wait 5 minutes. Still having chest pain, take 1 nitro, wait 5 minutes. Still having chest pain, take 1 nitro, dial 911. Total of 3 nitro in 15 minutes.  Labwork: Your physician recommends that you have the following labs drawn: NMR, lipoprotein  Testing/Procedures: You had an EKG today.  Please [facility] at xx:xx AM (30-45 minutes prior to test start time)  [facility] [address] [place], [place] [address] [phone]  Proceed to the [facility] Radiology Department (First Floor).  Please follow these instructions carefully (unless otherwise directed):  Hold all erectile dysfunction medications at least 48 hours prior to test.  On the Night Before the Test: . Drink plenty of water. . Do not consume any caffeinated/decaffeinated beverages or chocolate 12 hours prior to your test. . Do not take any antihistamines 12 hours prior to your test.  On the Day of the Test: . Drink plenty of water. Do not drink any water within one hour of the test. . Do not eat any food 4 hours prior to the test. . You may take your regular medications prior to the test. . IF NOT ON A BETA BLOCKER - Take 50 mg of lopressor (metoprolol) one hour before the test. . HOLD Inspra the morning of the test.  After the Test: . Drink plenty of water. . After receiving IV contrast, you may experience a mild flushed feeling. This is normal. . On occasion, you may experience a mild rash up to 24 hours after the test. This is not dangerous. If this occurs, you can take Benadryl 25 mg and  increase your fluid intake. . If you experience trouble breathing, this can be serious. If it is severe call 911 IMMEDIATELY. If it is mild, please call our office.  Follow-Up: Your physician recommends that you schedule a follow-up appointment in: 4 weeks   Any Other Special Instructions Will Be Listed Below (If Applicable).     If you need a refill on your cardiac medications before your next appointment, please call your pharmacy.    Hypertension Hypertension is another name for high blood pressure. High blood pressure forces your heart to work harder to pump blood. This can cause problems over time. There are two numbers in a blood pressure reading. There is a top number (systolic) over a bottom number (diastolic). It is best to have a blood pressure below 120/80. Healthy choices can help lower your blood pressure. You may need medicine to help lower your blood pressure if:  Your blood pressure cannot be lowered with healthy choices.  Your blood pressure is higher than 130/80.  Follow these instructions at home: Eating and drinking  If directed, follow the DASH eating plan. This diet includes: ? Filling half of your plate at each meal with fruits and vegetables. ? Filling one quarter of your plate at each meal with whole grains. Whole grains include whole wheat pasta, brown rice, and whole grain bread. ? Eating or drinking low-fat dairy products, such as skim milk or low-fat yogurt. ? Filling one quarter of your plate at each meal  with low-fat (lean) proteins. Low-fat proteins include fish, skinless chicken, eggs, beans, and tofu. ? Avoiding fatty meat, cured and processed meat, or chicken with skin. ? Avoiding premade or processed food.  Eat less than 1,500 mg of salt (sodium) a day.  Limit alcohol use to no more than 1 drink a day for nonpregnant women and 2 drinks a day for men. One drink equals 12 oz of beer, 5 oz of wine, or 1 oz of hard liquor. Lifestyle  Work with  your doctor to stay at a healthy weight or to lose weight. Ask your doctor what the best weight is for you.  Get at least 30 minutes of exercise that causes your heart to beat faster (aerobic exercise) most days of the week. This may include walking, swimming, or biking.  Get at least 30 minutes of exercise that strengthens your muscles (resistance exercise) at least 3 days a week. This may include lifting weights or pilates.  Do not use any products that contain nicotine or tobacco. This includes cigarettes and e-cigarettes. If you need help quitting, ask your doctor.  Check your blood pressure at home as told by your doctor.  Keep all follow-up visits as told by your doctor. This is important. Medicines  Take over-the-counter and prescription medicines only as told by your doctor. Follow directions carefully.  Do not skip doses of blood pressure medicine. The medicine does not work as well if you skip doses. Skipping doses also puts you at risk for problems.  Ask your doctor about side effects or reactions to medicines that you should watch for. Contact a doctor if:  You think you are having a reaction to the medicine you are taking.  You have headaches that keep coming back (recurring).  You feel dizzy.  You have swelling in your ankles.  You have trouble with your vision. Get help right away if:  You get a very bad headache.  You start to feel confused.  You feel weak or numb.  You feel faint.  You get very bad pain in your: ? Chest. ? Belly (abdomen).  You throw up (vomit) more than once.  You have trouble breathing. Summary  Hypertension is another name for high blood pressure.  Making healthy choices can help lower blood pressure. If your blood pressure cannot be controlled with healthy choices, you may need to take medicine. This information is not intended to replace advice given to you by your health care provider. Make sure you discuss any questions you  have with your health care provider. Document Released: 08/05/2007 Document Revised: 01/15/2016 Document Reviewed: 01/15/2016 Elsevier Interactive Patient Education  [employer].

## 2017-05-13 NOTE — Addendum Note (Signed)
Addended by: Ayesha MohairWELLS, MICHAELA E on: 05/13/2017 02:12 PM   Modules accepted: Orders

## 2017-05-14 LAB — NMR, LIPOPROFILE
CHOLESTEROL, TOTAL: 185 mg/dL (ref 100–199)
HDL Particle Number: 25 umol/L — ABNORMAL LOW (ref >=30.5)
HDL-C: 33 mg/dL — AB (ref >39)
LDL PARTICLE NUMBER: 1582 nmol/L — AB (ref <1000)
LDL SIZE: 20.8 nm (ref >20.5)
LDL-C: 117 mg/dL — ABNORMAL HIGH (ref 0–99)
LP-IR Score: 82 — ABNORMAL HIGH (ref <=45)
Small LDL Particle Number: 771 nmol/L — ABNORMAL HIGH (ref <=527)
TRIGLYCERIDES: 177 mg/dL — AB (ref 0–149)

## 2017-06-08 ENCOUNTER — Telehealth: Payer: Self-pay | Admitting: Cardiology

## 2017-06-08 NOTE — Telephone Encounter (Signed)
Patient has not been scheduled for CT, should he reschedule appt?

## 2017-06-08 NOTE — Telephone Encounter (Signed)
Advised patient message has been sent to scheduler for follow-up. Appointment with Dr. Dulce SellarMunley rescheduled for 06/25/17 at 8:40 am. Patient verbalized understanding, no further questions.

## 2017-06-11 ENCOUNTER — Ambulatory Visit: Payer: Managed Care, Other (non HMO) | Admitting: Cardiology

## 2017-06-25 ENCOUNTER — Ambulatory Visit: Payer: Managed Care, Other (non HMO) | Admitting: Cardiology

## 2017-07-02 ENCOUNTER — Ambulatory Visit (HOSPITAL_COMMUNITY)
Admission: RE | Admit: 2017-07-02 | Discharge: 2017-07-02 | Disposition: A | Discharge status: Home / Self Care | Payer: Managed Care, Other (non HMO) | Source: Ambulatory Visit | Attending: Cardiology | Admitting: Cardiology

## 2017-07-02 ENCOUNTER — Ambulatory Visit (HOSPITAL_COMMUNITY): Admission: RE | Admit: 2017-07-02 | Payer: Managed Care, Other (non HMO) | Source: Ambulatory Visit

## 2017-07-02 DIAGNOSIS — R079 Chest pain, unspecified: Secondary | ICD-10-CM | POA: Diagnosis present

## 2017-07-02 MED ORDER — NITROGLYCERIN 0.4 MG SL SUBL
SUBLINGUAL_TABLET | SUBLINGUAL | Status: AC
  Filled 2017-07-02: qty 2

## 2017-07-02 MED ORDER — NITROGLYCERIN 0.4 MG SL SUBL
0.8000 mg | SUBLINGUAL_TABLET | Freq: Once | SUBLINGUAL | Status: AC
  Administered 2017-07-02: 0.8 mg via SUBLINGUAL
  Filled 2017-07-02: qty 25

## 2017-07-02 MED ORDER — METOPROLOL TARTRATE 5 MG/5ML IV SOLN
5.0000 mg | INTRAVENOUS | Status: AC | PRN
  Administered 2017-07-02 (×4): 5 mg via INTRAVENOUS

## 2017-07-02 MED ORDER — IOPAMIDOL (ISOVUE-370) INJECTION 76%
100.0000 mL | Freq: Once | INTRAVENOUS | Status: AC | PRN
  Administered 2017-07-02: 80 mL via INTRAVENOUS

## 2017-07-02 MED ORDER — METOPROLOL TARTRATE 5 MG/5ML IV SOLN
INTRAVENOUS | Status: AC
  Administered 2017-07-02: 5 mg
  Filled 2017-07-02: qty 15

## 2017-07-02 MED ORDER — METOPROLOL TARTRATE 5 MG/5ML IV SOLN
5.0000 mg | Freq: Once | INTRAVENOUS | Status: DC
  Filled 2017-07-02: qty 5

## 2017-07-02 MED ORDER — IOPAMIDOL (ISOVUE-370) INJECTION 76%
INTRAVENOUS | Status: AC
  Filled 2017-07-02: qty 100

## 2017-07-02 MED ORDER — METOPROLOL TARTRATE 5 MG/5ML IV SOLN
INTRAVENOUS | Status: AC
  Filled 2017-07-02: qty 10

## 2017-07-02 NOTE — Progress Notes (Signed)
Ct complete. Denies any complaints. Patient given drink.

## 2017-07-02 NOTE — Progress Notes (Signed)
Patient ambulatory out of department with steady gait. Voices no complaints.

## 2017-07-12 NOTE — Progress Notes (Deleted)
Cardiology Office Note:    Date:  07/12/2017   ID:  [name], DOB 21-Oct-1980, MRN [id number]  PCP:  Bryon Lions, PA-C  Cardiologist:  Norman Herrlich, MD    Referring MD: Bryon Lions, PA-C    ASSESSMENT:    1. Chest pain in adult   2. Essential hypertension    PLAN:    In order of problems listed above:  1. ***   Next appointment: ***   Medication Adjustments/Labs and Tests Ordered: Current medicines are reviewed at length with the patient today.  Concerns regarding medicines are outlined above.  No orders of the defined types were placed in this encounter.  No orders of the defined types were placed in this encounter.   No chief complaint on file.   History of Present Illness:    Douglas Vazquez is a 37 y.o. male with a hx of hypertension, low HDL 33 in 2016, smoker and FH of CAD last seen 06/13/17 for chest pain.  ASSESSMENT:    05/13/17   1. Chest pain in adult   2. Essential hypertension    PLAN:    1.    His symptoms are best described as anginal and despite his age he is at intermediate risk of coronary disease with cigarette smoking low HDL and family history of premature CAD.  His EKG remains normal I asked him to take aspirin 81 mg daily continue his calcium channel blocker and for further evaluation will have a cardiac CTA.  I think is a good test to answer the question of whether or not he has coronary disease but also to look at other structures including his thoracic aorta as he tells me he has initial blood pressure was 260/120 in the ambulance but surprisingly normalized without drug therapy. 2. Stable I have added a low-dose of a distal diuretic for better BP control continue his calcium channel blocker.  Compliance with diet, lifestyle and medications: *** Past Medical History:  Diagnosis Date  . Allergy   . Anxiety   . Ear congestion, bilateral 03/08/2017   Last Assessment & Plan:  Patient notes recurrent infections in the  past. States he has a congestion/fluid-like feeling in the [place] today. States that his been going on for a couple weeks. Notes that it is concurrent with increase in fatigue. Denies any true pain. Denies any fever. No recent upper respiratory infection symptoms. Physical exam does not reveal any signs of infection or fluid behind   . Essential hypertension 06/28/2014  . Fatigue 03/08/2017   Last Assessment & Plan:  Patient notices new onset of fatigue 4 weeks ago. He states that there is no changes in his routine. He denies any changes in medication or diet. No evidence of active bleeding. Does note that he is noncompliant with his CPAP machine. Advised that we would do blood work today. Patient refused and states that he would like to repeat in 4 weeks as he has not had anything to   . Herniated disc   . Hypertension   . Insomnia 03/08/2017   Last Assessment & Plan:  Patient notes some difficulty sleeping when he is trying to wear his CPAP machine. He states that also he has a little bit of restlessness at night. Is inquiring for a sleep aid to help him with these symptoms. Has tried melatonin in the past. Will prescribe Rozerem 8 mg tablets as a starting point to help with difficulty sleeping, in order to try and avoid increasing  fati  . Non-cardiac chest pain   . Numbness and tingling in right hand 03/08/2017   Last Assessment & Plan:  Occurs mostly at night. Noticing symptoms are worsening. Notices some tingling and numbness in the right hand. Has not used any treatments thus far. Advised to use a wrist brace at night to help with likely carpal tunnel-like symptoms. Advised to follow up in 4 weeks.  . Obese   . Obesity (BMI 35.0-39.9 without comorbidity) 06/25/2015  . OSA (obstructive sleep apnea) 09/30/2015   Overview:  08/2015  HSAT  AHI 21.4  Sat 80 % Average Sat 92%  Last Assessment & Plan:  Patient previously diagnosed. Noncompliant with CPAP at this time. Expereincing increased fatigue. Notes  excessive daytime tiredness. States that he has difficulty sleeping with CPAP mask. Is recommended to wear the full mask as he sleeps with his mouth open. Has not been back to see a sleep doctor recently. [place]  . Psoriasis 06/28/2014   Under care of dermatologist, Dr Andrey Campanile   . Rhinitis, allergic 06/28/2014  . Smoker   . Tobacco use 04/09/2017   Last Assessment & Plan:  Smoking cessation desired. Previously failed chantix due to irritability. > 1PPD. 21 pack-year history. Will start bupropion  for 3 days and increase to . Patient reports readiness for quitting. Offered talk therapy referral. 1800 QUIT LINE as well. Patient will try medications. Advised to pick a quit date, use other healthy habits to replace oral fixation during    Past Surgical History:  Procedure Laterality Date  . TENDON REPAIR Right    R index finger tendon repair     Current Medications: No outpatient medications have been marked as taking for the 07/13/17 encounter (Appointment) with Baldo Daub, MD.     Allergies:   Doxycycline   Social History   Socioeconomic History  . Marital status: Married    Spouse name: Not on file  . Number of children: Not on file  . Years of education: Not on file  . Highest education level: Not on file  Occupational History  . Occupation: Teaching laboratory technician  Social Needs  . Financial resource strain: Not on file  . Food insecurity:    Worry: Not on file    Inability: Not on file  . Transportation needs:    Medical: Not on file    Non-medical: Not on file  Tobacco Use  . Smoking status: Current Every Day Smoker    Packs/day: 0.50  . Smokeless tobacco: Never Used  Substance and Sexual Activity  . Alcohol use: No  . Drug use: No  . Sexual activity: Not on file  Lifestyle  . Physical activity:    Days per week: Not on file    Minutes per session: Not on file  . Stress: Not on file  Relationships  . Social connections:    Talks on phone: Not on file     Gets together: Not on file    Attends religious service: Not on file    Active member of club or organization: Not on file    Attends meetings of clubs or organizations: Not on file    Relationship status: Not on file  Other Topics Concern  . Not on file  Social History Narrative   Exercise weights 3 times/week for 30-45 minutes     Family History: The patient's ***family history includes Anxiety disorder in his mother; Cancer in his mother; Dementia in his paternal grandmother; Diabetes in his father; Heart  attack in his father and paternal uncle; Hypertension in his father; Stroke in his father. ROS:   Please see the history of present illness.    All other systems reviewed and are negative.  EKGs/Labs/Other Studies Reviewed:    The following studies were reviewed today:  Cardiac CTA: Coronary Arteries: Right dominant with no anomalies LM: No plaque or stenosis LAD system: No plaque or stenosis. Circumflex system: No plaque or stenosis. RCA system: No plaque or stenosis. IMPRESSION: 1. Coronary artery calcium score 0 Agatston units, suggesting low risk for future cardiac events. 2.  No plaque or stenosis noted in the coronary tree.  Recent Labs: 05/03/2017: BUN 10; Creatinine, Ser 1.02; Hemoglobin 15.8; Platelets 236; Potassium 3.2; Sodium 140  Recent Lipid Panel    Component Value Date/Time   CHOL 186 07/31/2014 1546   TRIG 220 (H) 07/31/2014 1546   HDL 33 (L) 07/31/2014 1546   CHOLHDL 5.6 07/31/2014 1546   VLDL 44 (H) 07/31/2014 1546   LDLCALC 109 (H) 07/31/2014 1546    Physical Exam:    VS:  There were no vitals taken for this visit.    Wt Readings from Last 3 Encounters:  05/13/17 241 lb 1.9 oz (109.4 kg)  05/03/17 245 lb (111.1 kg)  04/23/15 242 lb (109.8 kg)     GEN: *** Well nourished, well developed in no acute distress HEENT: Normal NECK: No JVD; No carotid bruits LYMPHATICS: No lymphadenopathy CARDIAC: ***RRR, no murmurs, rubs,  gallops RESPIRATORY:  Clear to auscultation without rales, wheezing or rhonchi  ABDOMEN: Soft, non-tender, non-distended MUSCULOSKELETAL:  No edema; No deformity  SKIN: Warm and dry NEUROLOGIC:  Alert and oriented x 3 PSYCHIATRIC:  Normal affect    Signed, Norman Herrlich, MD  07/12/2017 5:03 PM    [facility]

## 2017-07-13 ENCOUNTER — Ambulatory Visit: Payer: Managed Care, Other (non HMO) | Admitting: Cardiology

## 2017-07-28 NOTE — Progress Notes (Deleted)
Cardiology Office Note:    Date:  07/28/2017   ID:  Douglas Vazquez, DOB [birth year], MRN [id number]  PCP:  Bryon Lions, PA-C  Cardiologist:  Norman Herrlich, MD    Referring MD: Bryon Lions, PA-C    ASSESSMENT:    No diagnosis found. PLAN:    In order of problems listed above:  1. ***   Next appointment: ***   Medication Adjustments/Labs and Tests Ordered: Current medicines are reviewed at length with the patient today.  Concerns regarding medicines are outlined above.  No orders of the defined types were placed in this encounter.  No orders of the defined types were placed in this encounter.   No chief complaint on file.   History of Present Illness:    Douglas Vazquez is a 37 y.o. male with a hx of hypertension last seen 05/13/17 for chest pain.  Cardiac CTA 07/02/17: IMPRESSION: 1. Coronary artery calcium score 0 Agatston units, suggesting low risk for future cardiac events. 2.  No plaque or stenosis noted in the coronary tree.  Compliance with diet, lifestyle and medications: *** Past Medical History:  Diagnosis Date  . Allergy   . Anxiety   . Ear congestion, bilateral 03/08/2017   Last Assessment & Plan:  Patient notes recurrent infections in the past. States he has a congestion/fluid-like feeling in the [place] today. States that his been going on for a couple weeks. Notes that it is concurrent with increase in fatigue. Denies any true pain. Denies any fever. No recent upper respiratory infection symptoms. Physical exam does not reveal any signs of infection or fluid behind   . Essential hypertension 06/28/2014  . Fatigue 03/08/2017   Last Assessment & Plan:  Patient notices new onset of fatigue 4 weeks ago. He states that there is no changes in his routine. He denies any changes in medication or diet. No evidence of active bleeding. Does note that he is noncompliant with his CPAP machine. Advised that we would do blood work today. Patient refused and  states that he would like to repeat in 4 weeks as he has not had anything to   . Herniated disc   . Hypertension   . Insomnia 03/08/2017   Last Assessment & Plan:  Patient notes some difficulty sleeping when he is trying to wear his CPAP machine. He states that also he has a little bit of restlessness at night. Is inquiring for a sleep aid to help him with these symptoms. Has tried melatonin in the past. Will prescribe Rozerem 8 mg tablets as a starting point to help with difficulty sleeping, in order to try and avoid increasing fati  . Non-cardiac chest pain   . Numbness and tingling in right hand 03/08/2017   Last Assessment & Plan:  Occurs mostly at night. Noticing symptoms are worsening. Notices some tingling and numbness in the right hand. Has not used any treatments thus far. Advised to use a wrist brace at night to help with likely carpal tunnel-like symptoms. Advised to follow up in 4 weeks.  . Obese   . Obesity (BMI 35.0-39.9 without comorbidity) 06/25/2015  . OSA (obstructive sleep apnea) 09/30/2015   Overview:  08/2015  HSAT  AHI 21.4  Sat 80 % Average Sat 92%  Last Assessment & Plan:  Patient previously diagnosed. Noncompliant with CPAP at this time. Expereincing increased fatigue. Notes excessive daytime tiredness. States that he has difficulty sleeping with CPAP mask. Is recommended to wear the full mask as he sleeps  with his mouth open. Has not been back to see a sleep doctor recently. [place]  . Psoriasis 06/28/2014   Under care of dermatologist, Dr Andrey Campanile   . Rhinitis, allergic 06/28/2014  . Smoker   . Tobacco use 04/09/2017   Last Assessment & Plan:  Smoking cessation desired. Previously failed chantix due to irritability. > 1PPD. 21 pack-year history. Will start bupropion  for 3 days and increase to . Patient reports readiness for quitting. Offered talk therapy referral. 1800 QUIT LINE as well. Patient will try medications. Advised to pick a quit date, use other healthy habits to  replace oral fixation during    Past Surgical History:  Procedure Laterality Date  . TENDON REPAIR Right    R index finger tendon repair     Current Medications: No outpatient medications have been marked as taking for the 07/30/17 encounter (Appointment) with Baldo Daub, MD.     Allergies:   Doxycycline   Social History   Socioeconomic History  . Marital status: Married    Spouse name: Not on file  . Number of children: Not on file  . Years of education: Not on file  . Highest education level: Not on file  Occupational History  . Occupation: Teaching laboratory technician  Social Needs  . Financial resource strain: Not on file  . Food insecurity:    Worry: Not on file    Inability: Not on file  . Transportation needs:    Medical: Not on file    Non-medical: Not on file  Tobacco Use  . Smoking status: Current Every Day Smoker    Packs/day: 0.50  . Smokeless tobacco: Never Used  Substance and Sexual Activity  . Alcohol use: No  . Drug use: No  . Sexual activity: Not on file  Lifestyle  . Physical activity:    Days per week: Not on file    Minutes per session: Not on file  . Stress: Not on file  Relationships  . Social connections:    Talks on phone: Not on file    Gets together: Not on file    Attends religious service: Not on file    Active member of club or organization: Not on file    Attends meetings of clubs or organizations: Not on file    Relationship status: Not on file  Other Topics Concern  . Not on file  Social History Narrative   Exercise weights 3 times/week for 30-45 minutes     Family History: The patient's ***family history includes Anxiety disorder in his mother; Cancer in his mother; Dementia in his paternal grandmother; Diabetes in his father; Heart attack in his father and paternal uncle; Hypertension in his father; Stroke in his father. ROS:   Please see the history of present illness.    All other systems reviewed and are  negative.  EKGs/Labs/Other Studies Reviewed:    The following studies were reviewed today:  EKG:  EKG ordered today.  The ekg ordered today demonstrates ***  Recent Labs: 05/03/2017: BUN 10; Creatinine, Ser 1.02; Hemoglobin 15.8; Platelets 236; Potassium 3.2; Sodium 140  Recent Lipid Panel    Component Value Date/Time   CHOL 186 07/31/2014 1546   TRIG 220 (H) 07/31/2014 1546   HDL 33 (L) 07/31/2014 1546   CHOLHDL 5.6 07/31/2014 1546   VLDL 44 (H) 07/31/2014 1546   LDLCALC 109 (H) 07/31/2014 1546    Physical Exam:    VS:  There were no vitals taken for this visit.  Wt Readings from Last 3 Encounters:  05/13/17 241 lb 1.9 oz (109.4 kg)  05/03/17 245 lb (111.1 kg)  04/23/15 242 lb (109.8 kg)     GEN:  Well nourished, well developed in no acute distress HEENT: Normal NECK: No JVD; No carotid bruits LYMPHATICS: No lymphadenopathy CARDIAC: RRR, no murmurs, rubs, gallops RESPIRATORY:  Clear to auscultation without rales, wheezing or rhonchi  ABDOMEN: Soft, non-tender, non-distended MUSCULOSKELETAL:  No edema; No deformity  SKIN: Warm and dry NEUROLOGIC:  Alert and oriented x 3 PSYCHIATRIC:  Normal affect    Signed, Norman Herrlich, MD  07/28/2017 2:02 PM    [facility]

## 2017-07-30 ENCOUNTER — Ambulatory Visit: Payer: Managed Care, Other (non HMO) | Admitting: Cardiology

## 2017-07-30 DIAGNOSIS — I2510 Atherosclerotic heart disease of native coronary artery without angina pectoris: Secondary | ICD-10-CM | POA: Insufficient documentation

## 2018-08-15 ENCOUNTER — Other Ambulatory Visit: Payer: Self-pay | Admitting: Family Medicine

## 2018-08-15 DIAGNOSIS — T148XXA Other injury of unspecified body region, initial encounter: Secondary | ICD-10-CM

## 2018-08-16 ENCOUNTER — Ambulatory Visit
Admission: RE | Admit: 2018-08-16 | Discharge: 2018-08-16 | Disposition: A | Discharge status: Home / Self Care | Payer: 59 | Source: Ambulatory Visit | Attending: Family Medicine | Admitting: Family Medicine

## 2018-08-16 DIAGNOSIS — T148XXA Other injury of unspecified body region, initial encounter: Secondary | ICD-10-CM

## 2019-02-28 IMAGING — CT CT HEART MORP W/ CTA COR W/ SCORE W/ CA W/CM &/OR W/O CM
4 of 7 series · 8 of 20 positions shown, 9 images · non-contrast
Comparison: None.

CLINICAL DATA: Chest pain

EXAM:
Cardiac CTA
MEDICATIONS:
Sub lingual nitro. 4mg x 2 and lopressor 20mg IV
TECHNIQUE: The patient was scanned on a Siemens [REDACTED]ice scanner. Gantry
rotation speed was 250 msecs. Collimation was 0.6 mm. A 100 kV
prospective scan was triggered in the ascending thoracic aorta at
35-75% of the R-R interval. Average HR during the scan was 60 bpm.
The 3D data set was interpreted on a dedicated work station using
MPR, MIP and VRT modes. A total of 80cc of contrast was used.

[Series 7: best diast 74 % · axial · 0.41mm/px · z∈[+1173,+1220]mm · 2 of 356 slices shown, 3 images]
[im 119/356  vessel]
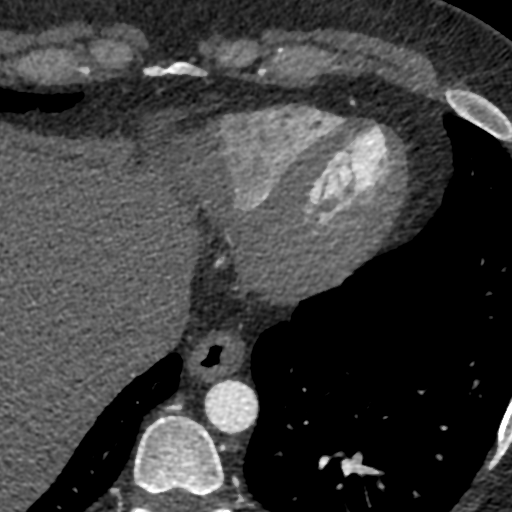
[im 119/356  lung]
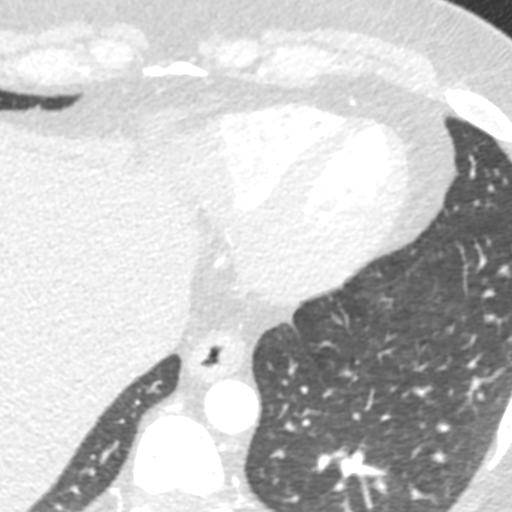
[im 237/356  vessel]
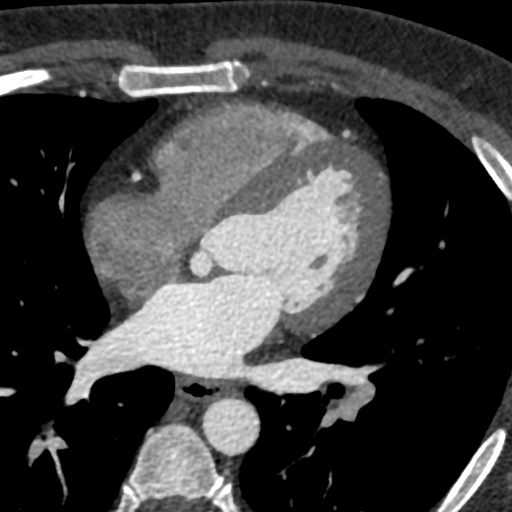

[Series 8: best syst 40 % · axial · 0.41mm/px · z∈[+1173,+1220]mm · 2 of 356 slices shown]
[im 119/356  vessel]
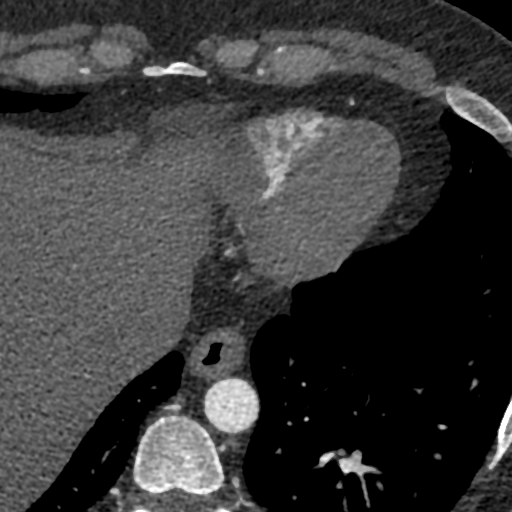
[im 237/356  vessel]
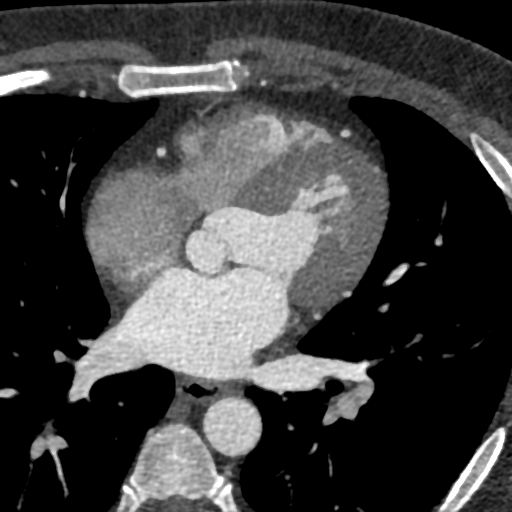

[Series 9: ts diast sharp 74 % · axial · 0.41mm/px · z∈[+1173,+1220]mm · 2 of 356 slices shown]
[im 119/356  lung]
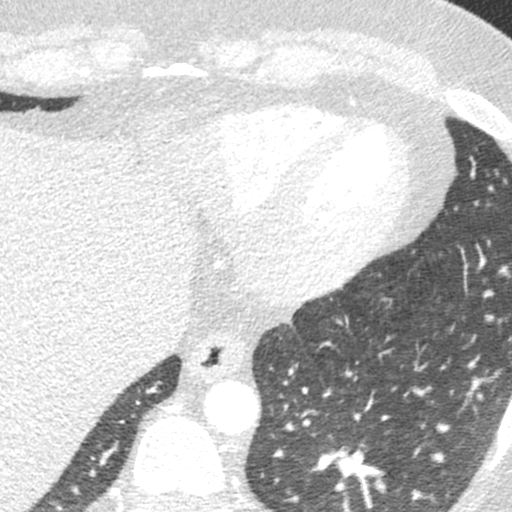
[im 237/356  lung]
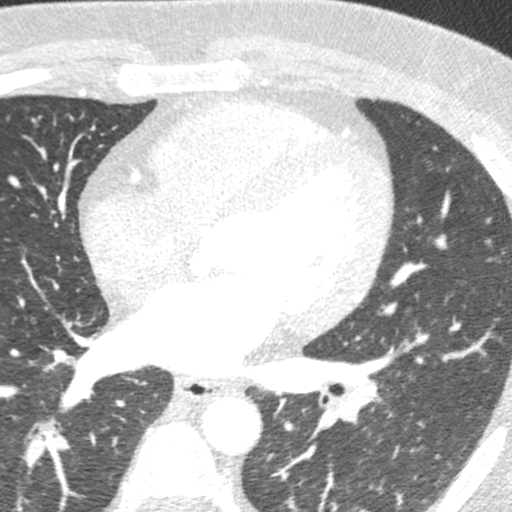

[Series 10: ts syst sharp 40 % · axial · 0.41mm/px · z∈[+1173,+1220]mm · 2 of 356 slices shown]
[im 119/356  lung]
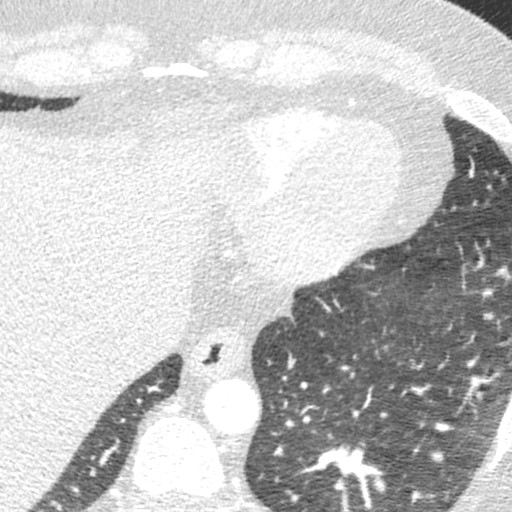
[im 237/356  lung]
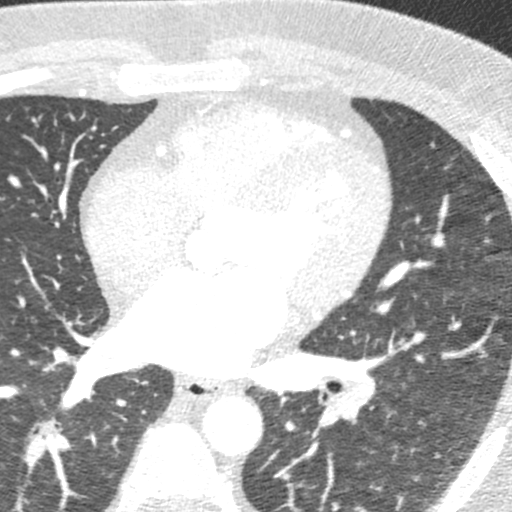

[8 of 20 positions shown; findings below may reference images not displayed]

FINDINGS: Non-cardiac: See separate report from [REDACTED].

Calcium Score: 0 Agatston units.

Coronary Arteries: Right dominant with no anomalies

LM: No plaque or stenosis.

LAD system: No plaque or stenosis.

Circumflex system: No plaque or stenosis.

RCA system: No plaque or stenosis.
IMPRESSION: 1. Coronary artery calcium score 0 Agatston units, suggesting low
risk for future cardiac events.

2.  No plaque or stenosis noted in the coronary tree.

Felner Ceola

EXAM:
OVER-READ INTERPRETATION  CT CHEST

The following report is an over-read performed by radiologist Dr.
Rinnah Tiger [REDACTED] on 07/02/2017. This
over-read does not include interpretation of cardiac or coronary
anatomy or pathology. The coronary calcium score/coronary CTA
interpretation by the cardiologist is attached.
FINDINGS: Within the visualized portions of the thorax there are no suspicious
appearing pulmonary nodules or masses, there is no acute
consolidative airspace disease, no pleural effusions, no
pneumothorax and no lymphadenopathy. Visualized portions of the
upper abdomen are unremarkable. There are no aggressive appearing
lytic or blastic lesions noted in the visualized portions of the
skeleton.
IMPRESSION: No significant incidental noncardiac findings are noted.

## 2019-07-25 ENCOUNTER — Ambulatory Visit (INDEPENDENT_AMBULATORY_CARE_PROVIDER_SITE_OTHER)
Admission: RE | Admit: 2019-07-25 | Discharge: 2019-07-25 | Disposition: A | Discharge status: Home / Self Care | Payer: 59 | Source: Ambulatory Visit

## 2019-07-25 DIAGNOSIS — R0982 Postnasal drip: Secondary | ICD-10-CM

## 2019-07-25 DIAGNOSIS — R059 Cough, unspecified: Secondary | ICD-10-CM

## 2019-07-25 DIAGNOSIS — R0981 Nasal congestion: Secondary | ICD-10-CM | POA: Diagnosis not present

## 2019-07-25 MED ORDER — PREDNISONE 50 MG PO TABS: 50.0000 mg | ORAL_TABLET | Freq: Every day | ORAL | 0 refills | Status: DC

## 2019-07-25 MED ORDER — AMOXICILLIN-POT CLAVULANATE 875-125 MG PO TABS: 1.0000 | ORAL_TABLET | Freq: Two times a day (BID) | ORAL | 0 refills | Status: DC

## 2019-07-25 NOTE — ED Provider Notes (Signed)
Virtual Visit via Video Note:  Douglas Vazquez  initiated request for Telemedicine visit with [facility] team. I connected with Douglas Vazquez  on 07/25/2019 at 6:48 PM  for a synchronized telemedicine visit using a video enabled HIPPA compliant telemedicine application. I verified that I am speaking with Douglas Vazquez  using two identifiers. Douglas Vazquez Doree Fudge, PA-C  was physically located in a [facility]gent care site and Lemario Chaikin was located at a different location.   The limitations of evaluation and management by telemedicine as well as the availability of in-person appointments were discussed. Patient was informed that he  may incur a bill ( including co-pay) for this virtual visit encounter. Douglas Vazquez  expressed understanding and gave verbal consent to proceed with virtual visit.     History of Present Illness:Douglas Vazquez  is a 39 y.o. male presents with 1 week history of URI symptoms. Nasal congestion, cough, rhinorrhea. Intermittent productive cough. Denies fevers, shortness of breath, abdominal pain, nausea, vomiting, diarrhea, loss of taste/smell. Current every day smoker, 20+ pack year history. No COVID testing. No COVID vaccine. Zyrtec, flonase, azelastine nasal spray.   Wife with similar symptoms, has COVID testing pending.   Past Medical History:  Diagnosis Date  . Allergy   . Anxiety   . Ear congestion, bilateral 03/08/2017   Last Assessment & Plan:  Patient notes recurrent infections in the past. States he has a congestion/fluid-like feeling in the [place] today. States that his been going on for a couple weeks. Notes that it is concurrent with increase in fatigue. Denies any true pain. Denies any fever. No recent upper respiratory infection symptoms. Physical exam does not reveal any signs of infection or fluid behind   . Essential hypertension 06/28/2014  . Fatigue 03/08/2017   Last Assessment & Plan:  Patient notices new onset of fatigue 4 weeks ago.  He states that there is no changes in his routine. He denies any changes in medication or diet. No evidence of active bleeding. Does note that he is noncompliant with his CPAP machine. Advised that we would do blood work today. Patient refused and states that he would like to repeat in 4 weeks as he has not had anything to   . Herniated disc   . Hypertension   . Insomnia 03/08/2017   Last Assessment & Plan:  Patient notes some difficulty sleeping when he is trying to wear his CPAP machine. He states that also he has a little bit of restlessness at night. Is inquiring for a sleep aid to help him with these symptoms. Has tried melatonin in the past. Will prescribe Rozerem 8 mg tablets as a starting point to help with difficulty sleeping, in order to try and avoid increasing fati  . Non-cardiac chest pain   . Numbness and tingling in right hand 03/08/2017   Last Assessment & Plan:  Occurs mostly at night. Noticing symptoms are worsening. Notices some tingling and numbness in the right hand. Has not used any treatments thus far. Advised to use a wrist brace at night to help with likely carpal tunnel-like symptoms. Advised to follow up in 4 weeks.  . Obese   . Obesity (BMI 35.0-39.9 without comorbidity) 06/25/2015  . OSA (obstructive sleep apnea) 09/30/2015   Overview:  08/2015  HSAT  AHI 21.4  Sat 80 % Average Sat 92%  Last Assessment & Plan:  Patient previously diagnosed. Noncompliant with CPAP at this time. Expereincing increased fatigue. Notes excessive daytime tiredness. States  that he has difficulty sleeping with CPAP mask. Is recommended to wear the full mask as he sleeps with his mouth open. Has not been back to see a sleep doctor recently. Orde  . Psoriasis 06/28/2014   Under care of dermatologist, Dr Redmond Pulling   . Rhinitis, allergic 06/28/2014  . Smoker   . Tobacco use 04/09/2017   Last Assessment & Plan:  Smoking cessation desired. Previously failed chantix due to irritability. > 1PPD. 21 pack-year history.  Will start bupropion 150mg  for 3 days and increase to 300mg . Patient reports readiness for quitting. Offered talk therapy referral. [place] as well. Patient will try medications. Advised to pick a quit date, use other healthy habits to replace oral fixation during    Allergies  Allergen Reactions  . Doxycycline Rash        Observations/Objective: General: Well appearing, nontoxic, no acute distress. Sitting comfortably. Head: Normocephalic, atraumatic Eye: No conjunctival injection, eyelid swelling. EOMI ENT: Mucus membranes moist, no lip cracking. No obvious nasal drainage. Pulm: Speaking in full sentences without difficulty. Normal effort. No respiratory distress, accessory muscle use. Neuro: Normal mental status. Alert and oriented x 3.  Assessment and Plan: Given continued significant nasal drainage despite on multiple nasal sprays, would cover for bacterial sinusitis. Augmentin as directed. Prednisone for sinus pressure/cough. Other symptomatic treatment discussed. Return precautions given. Patient expresses understanding and agrees to plan.  Follow Up Instructions:    I discussed the assessment and treatment plan with the patient. The patient was provided an opportunity to ask questions and all were answered. The patient agreed with the plan and demonstrated an understanding of the instructions.   The patient was advised to call back or seek an in-person evaluation if the symptoms worsen or if the condition fails to improve as anticipated.  I provided 15 minutes of non-face-to-face time during this encounter.    Douglas Edwards, PA-C  07/25/2019 6:48 PM         Douglas Edwards, PA-C 07/25/19 1849

## 2019-07-25 NOTE — Discharge Instructions (Addendum)
Start augmentin and prednisone for possible sinus infection. Continue nasal sprays. Keep hydrated, your urine should be clear to pale yellow in color. Tylenol/motrin for fever and pain. Monitor for any worsening of symptoms, chest pain, shortness of breath, wheezing, swelling of the throat, go to the emergency department for further evaluation needed. Otherwise, follow up in person if symptoms not improving.  [facility] at [facility]) [address] [place], [place], [place] [address] [phone]  [facility] at [facility] [address] [place], [place] [address] [phone]  [facility] at [facility] [address] [place], [place] [address] [phone]  [facility] at [facility] [address], [address], [place], [place] [address] [phone]  [facility] at [facility] [address] [place]onnie Dawes [place], [place] [address] [phone]  [facility] at [facility] [address], [place], [place] [address] [phone]  If needing COVID testing:  To make appointment: You can text "COVID" to [phone] OR log on to [email or web address] If no smart phone or PC, you can call [phone] for assistance in setting up appointments.  COVID drive through sites:  [place]: [address] [place]: [address] [place]: [address]

## 2019-07-26 ENCOUNTER — Ambulatory Visit (HOSPITAL_COMMUNITY)
Admission: EM | Admit: 2019-07-26 | Discharge: 2019-07-26 | Disposition: A | Discharge status: Home / Self Care | Payer: 59 | Attending: Family Medicine | Admitting: Family Medicine

## 2019-07-26 ENCOUNTER — Encounter (HOSPITAL_COMMUNITY): Payer: Self-pay | Admitting: Emergency Medicine

## 2019-07-26 ENCOUNTER — Ambulatory Visit (INDEPENDENT_AMBULATORY_CARE_PROVIDER_SITE_OTHER): Payer: 59

## 2019-07-26 DIAGNOSIS — R05 Cough: Secondary | ICD-10-CM | POA: Diagnosis not present

## 2019-07-26 DIAGNOSIS — R062 Wheezing: Secondary | ICD-10-CM

## 2019-07-26 DIAGNOSIS — R059 Cough, unspecified: Secondary | ICD-10-CM

## 2019-07-26 MED ORDER — HYDROCODONE-HOMATROPINE 5-1.5 MG/5ML PO SYRP: 5.0000 mL | ORAL_SOLUTION | Freq: Four times a day (QID) | ORAL | 0 refills | Status: DC | PRN

## 2019-07-26 NOTE — Discharge Instructions (Addendum)

## 2019-07-26 NOTE — ED Triage Notes (Signed)
Pt c/o cough, congestion, and sore throat onset last night. Patient states he has seasonal allergies. His wife and daughter are currently on antibiotics with similar symptoms. Pt states he has had a video visit  But has not picked up any of the medication prescribed yet.

## 2019-07-26 NOTE — ED Provider Notes (Addendum)
[facility]   [id number] 07/26/19 Arrival Time: 1022  ASSESSMENT & PLAN:  1. Cough   2. Wheezing     I have personally viewed the imaging studies ordered this visit. No signs of pneumonia. Normal CXR.  Will fill Augmentin and prednisone prescribed yesterday via tele-visit.  Meds ordered this encounter  Medications  . HYDROcodone-homatropine (HYCODAN) 5-1.5 MG/5ML syrup    Sig: Take 5 mLs by mouth every 6 (six) hours as needed for cough.    Dispense:  90 mL    Refill:  0    OTC symptom care as needed. Ensure adequate fluid intake and rest.  May f/u here as needed.  Reviewed expectations re: course of current medical issues. Questions answered. Outlined signs and symptoms indicating need for more acute intervention. Patient verbalized understanding. After Visit Summary given.   SUBJECTIVE: History from: patient. Tele-visit dated 07/25/2019 reviewed by me. Has not filled the Augmentin and prednisone prescribed. Douglas Vazquez is a 39 y.o. male who presents with complaint of nasal congestion, post-nasal drainage, and sinus pain. Chronic seasonal allergies. Onset of worsening congestion over the past few weeks; gradual. Last evening had trouble sleeping secondary to persistent coughing. Fever: absent. Overall normal PO intake without n/v. OTC treatment: various without relief. History of frequent sinus infections: none reported. No specific aggravating or alleviating factors reported. Social History   Tobacco Use  Smoking Status Current Every Day Smoker  . Packs/day: 0.50  Smokeless Tobacco Never Used    ROS: As per HPI.  OBJECTIVE:  Vitals:   07/26/19 1126  BP: (!) 175/118  Pulse: 87  Resp: 16  Temp: 98.3 F (36.8 C)  TempSrc: Oral  SpO2: 98%     General appearance: alert; no distress HEENT: nasal congestion; clear runny nose; throat irritation secondary to post-nasal drainage; bilateral maxillary tenderness to palpation; turbinates boggy Neck:  supple without LAD; trachea midline Lungs: unlabored respirations, symmetrical air entry; cough: moderate; no respiratory distress; mild bilateral expiratory wheezing Skin: warm and dry Psychological: alert and cooperative; normal mood and affect  Allergies  Allergen Reactions  . Doxycycline Rash    Past Medical History:  Diagnosis Date  . Allergy   . Anxiety   . Ear congestion, bilateral 03/08/2017   Last Assessment & Plan:  Patient notes recurrent infections in the past. States he has a congestion/fluid-like feeling in the [place] today. States that his been going on for a couple weeks. Notes that it is concurrent with increase in fatigue. Denies any true pain. Denies any fever. No recent upper respiratory infection symptoms. Physical exam does not reveal any signs of infection or fluid behind   . Essential hypertension 06/28/2014  . Fatigue 03/08/2017   Last Assessment & Plan:  Patient notices new onset of fatigue 4 weeks ago. He states that there is no changes in his routine. He denies any changes in medication or diet. No evidence of active bleeding. Does note that he is noncompliant with his CPAP machine. Advised that we would do blood work today. Patient refused and states that he would like to repeat in 4 weeks as he has not had anything to   . Herniated disc   . Hypertension   . Insomnia 03/08/2017   Last Assessment & Plan:  Patient notes some difficulty sleeping when he is trying to wear his CPAP machine. He states that also he has a little bit of restlessness at night. Is inquiring for a sleep aid to help him with these symptoms. Has  tried melatonin in the past. Will prescribe Rozerem 8 mg tablets as a starting point to help with difficulty sleeping, in order to try and avoid increasing fati  . Non-cardiac chest pain   . Numbness and tingling in right hand 03/08/2017   Last Assessment & Plan:  Occurs mostly at night. Noticing symptoms are worsening. Notices some tingling and numbness in the  right hand. Has not used any treatments thus far. Advised to use a wrist brace at night to help with likely carpal tunnel-like symptoms. Advised to follow up in 4 weeks.  . Obese   . Obesity (BMI 35.0-39.9 without comorbidity) 06/25/2015  . OSA (obstructive sleep apnea) 09/30/2015   Overview:  08/2015  HSAT  AHI 21.4  Sat 80 % Average Sat 92%  Last Assessment & Plan:  Patient previously diagnosed. Noncompliant with CPAP at this time. Expereincing increased fatigue. Notes excessive daytime tiredness. States that he has difficulty sleeping with CPAP mask. Is recommended to wear the full mask as he sleeps with his mouth open. Has not been back to see a sleep doctor recently. [place]  . Psoriasis 06/28/2014   Under care of dermatologist, Dr Andrey Campanile   . Rhinitis, allergic 06/28/2014  . Smoker   . Tobacco use 04/09/2017   Last Assessment & Plan:  Smoking cessation desired. Previously failed chantix due to irritability. > 1PPD. 21 pack-year history. Will start bupropion 150mg  for 3 days and increase to 300mg . Patient reports readiness for quitting. Offered talk therapy referral. 1800 QUIT LINE as well. Patient will try medications. Advised to pick a quit date, use other healthy habits to replace oral fixation during   Family History  Problem Relation Age of Onset  . Heart attack Father   . Stroke Father   . Hypertension Father   . Diabetes Father   . Heart attack Paternal Uncle   . Anxiety disorder Mother   . Cancer Mother        breast  . Dementia Paternal Grandmother    Social History   Socioeconomic History  . Marital status: Married    Spouse name: Not on file  . Number of children: Not on file  . Years of education: Not on file  . Highest education level: Not on file  Occupational History  . Occupation: maintenance supervisor  Tobacco Use  . Smoking status: Current Every Day Smoker    Packs/day: 0.50  . Smokeless tobacco: Never Used  Substance and Sexual Activity  . Alcohol use: No  . Drug  use: No  . Sexual activity: Not on file  Other Topics Concern  . Not on file  Social History Narrative   Exercise weights 3 times/week for 30-45 minutes   Social Determinants of Health   Financial Resource Strain:   . Difficulty of Paying Living Expenses:   Food Insecurity:   . Worried About in the Last Year:   . in the Last Year:   Transportation Needs:   . Programme researcher, broadcasting/film/video (Medical):   Barista Lack of Transportation (Non-Medical):   Physical Activity:   . Days of Exercise per Week:   . Minutes of Exercise per Session:   Stress:   . Feeling of Stress :   Social Connections:   . Frequency of Communication with Friends and Family:   . Frequency of Social Gatherings with Friends and Family:   . Attends Religious Services:   . Active Member of Clubs or Organizations:   .  Attends Archivist Meetings:   Marland Kitchen Marital Status:   Intimate Partner Violence:   . Fear of Current or Ex-Partner:   . Emotionally Abused:   Marland Kitchen Physically Abused:   . Sexually Abused:             Vanessa Kick, MD 07/27/19 6825    Vanessa Kick, MD 07/27/19 0930

## 2020-06-09 ENCOUNTER — Emergency Department
Admission: RE | Admit: 2020-06-09 | Discharge: 2020-06-09 | Disposition: A | Discharge status: Home / Self Care | Payer: 59 | Source: Ambulatory Visit | Attending: Family Medicine | Admitting: Family Medicine

## 2020-06-09 ENCOUNTER — Other Ambulatory Visit: Payer: Self-pay

## 2020-06-09 VITALS — BP 157/108 | HR 85 | Temp 99.2°F | Resp 20 | Ht 68.0 in | Wt 245.0 lb

## 2020-06-09 DIAGNOSIS — J22 Unspecified acute lower respiratory infection: Secondary | ICD-10-CM | POA: Diagnosis not present

## 2020-06-09 MED ORDER — PREDNISONE 20 MG PO TABS: 20.0000 mg | ORAL_TABLET | Freq: Two times a day (BID) | ORAL | 0 refills | Status: DC

## 2020-06-09 MED ORDER — AMOXICILLIN-POT CLAVULANATE 875-125 MG PO TABS: 1.0000 | ORAL_TABLET | Freq: Two times a day (BID) | ORAL | 0 refills | Status: DC

## 2020-06-09 NOTE — ED Provider Notes (Signed)
[facility]    CSN: [id number] Arrival date & time: 06/09/20  1323      History   Chief Complaint Chief Complaint  Patient presents with  . Cough  . Nasal Congestion  . Generalized Body Aches    HPI Douglas Vazquez is a 40 y.o. male.   HPI  Patient states that he has not felt well for at least 3 weeks.  2 to 3 weeks ago he was treated for a sinus infection.  He was given prednisone.  This helped marginally and then he continued to have symptoms.  Think some of this may be allergy.  His daughter then came home with influenza.  He was treated with Tamiflu to help prevent symptoms.  He never did have fever chills body aches or signs of influenza although he continued to have nasal congestion, postnasal drip, sore throat, and cough.  He has not been vaccinated against COVID.  He chooses not to do this.  He did have a negative home Covid test.  His wife got sick this week and was diagnosed with bronchitis.  She is on antibiotics.  He feels he needs additional treatment.  He has continued to work in spite of his symptoms  Past Medical History:  Diagnosis Date  . Allergy   . Anxiety   . Ear congestion, bilateral 03/08/2017   Last Assessment & Plan:  Patient notes recurrent infections in the past. States he has a congestion/fluid-like feeling in the [place] today. States that his been going on for a couple weeks. Notes that it is concurrent with increase in fatigue. Denies any true pain. Denies any fever. No recent upper respiratory infection symptoms. Physical exam does not reveal any signs of infection or fluid behind   . Essential hypertension 06/28/2014  . Fatigue 03/08/2017   Last Assessment & Plan:  Patient notices new onset of fatigue 4 weeks ago. He states that there is no changes in his routine. He denies any changes in medication or diet. No evidence of active bleeding. Does note that he is noncompliant with his CPAP machine. Advised that we would do blood work today. Patient  refused and states that he would like to repeat in 4 weeks as he has not had anything to   . Herniated disc   . Hypertension   . Insomnia 03/08/2017   Last Assessment & Plan:  Patient notes some difficulty sleeping when he is trying to wear his CPAP machine. He states that also he has a little bit of restlessness at night. Is inquiring for a sleep aid to help him with these symptoms. Has tried melatonin in the past. Will prescribe Rozerem 8 mg tablets as a starting point to help with difficulty sleeping, in order to try and avoid increasing fati  . Non-cardiac chest pain   . Numbness and tingling in right hand 03/08/2017   Last Assessment & Plan:  Occurs mostly at night. Noticing symptoms are worsening. Notices some tingling and numbness in the right hand. Has not used any treatments thus far. Advised to use a wrist brace at night to help with likely carpal tunnel-like symptoms. Advised to follow up in 4 weeks.  . Obese   . Obesity (BMI 35.0-39.9 without comorbidity) 06/25/2015  . OSA (obstructive sleep apnea) 09/30/2015   Overview:  08/2015  HSAT  AHI 21.4  Sat 80 % Average Sat 92%  Last Assessment & Plan:  Patient previously diagnosed. Noncompliant with CPAP at this time. Expereincing increased fatigue. Notes  excessive daytime tiredness. States that he has difficulty sleeping with CPAP mask. Is recommended to wear the full mask as he sleeps with his mouth open. Has not been back to see a sleep doctor recently. [place]  . Psoriasis 06/28/2014   Under care of dermatologist, Dr Andrey Campanile   . Rhinitis, allergic 06/28/2014  . Smoker   . Tobacco use 04/09/2017   Last Assessment & Plan:  Smoking cessation desired. Previously failed chantix due to irritability. > 1PPD. 21 pack-year history. Will start bupropion 150mg  for 3 days and increase to 300mg . Patient reports readiness for quitting. Offered talk therapy referral. 1800 QUIT LINE as well. Patient will try medications. Advised to pick a quit date, use other healthy  habits to replace oral fixation during    Patient Active Problem List   Diagnosis Date Noted  . Tobacco use 04/09/2017  . Ear congestion, bilateral 03/08/2017  . Fatigue 03/08/2017  . Insomnia 03/08/2017  . Numbness and tingling in right hand 03/08/2017  . OSA (obstructive sleep apnea) 09/30/2015  . Obesity (BMI 35.0-39.9 without comorbidity) 06/25/2015  . Rhinitis, allergic 06/28/2014  . Psoriasis 06/28/2014  . Essential hypertension 06/28/2014    Past Surgical History:  Procedure Laterality Date  . TENDON REPAIR Right    R index finger tendon repair        Home Medications    Prior to Admission medications   Medication Sig Start Date End Date Taking? Authorizing Provider  amoxicillin-clavulanate (AUGMENTIN) 875-125 MG tablet Take 1 tablet by mouth every 12 (twelve) hours. 06/09/20  Yes 06/30/2014, MD  cetirizine (ZYRTEC) 10 MG tablet Take 10 mg by mouth daily.   Yes [provider]  predniSONE (DELTASONE) 20 MG tablet Take 1 tablet (20 mg total) by mouth 2 (two) times daily with a meal. 06/09/20  Yes Eustace Moore, MD  benazepril (LOTENSIN) 10 MG tablet Take 10 mg by mouth daily.    [provider]  hydrochlorothiazide (MICROZIDE) 12.5 MG capsule Take 12.5 mg by mouth daily. 05/25/20   [provider]  nitroGLYCERIN (NITROSTAT) 0.4 MG SL tablet Place 1 tablet (0.4 mg total) under the tongue every 5 (five) minutes as needed for chest pain. 05/13/17 08/11/17  05/15/17, MD  amLODipine (NORVASC) 5 MG tablet Take 10 mg by mouth daily. 04/05/17 06/09/20  [provider]  eplerenone (INSPRA) 50 MG tablet Take 1 tablet (50 mg total) by mouth daily. 05/13/17 07/25/19  05/15/17, MD  metoprolol tartrate (LOPRESSOR) 50 MG tablet Take 1 tablet (50 mg total) by mouth once for 1 dose. Take 1 hour before cardiac CTA. 05/13/17 07/25/19  05/15/17, MD    Family History Family History  Problem Relation Age of Onset  . Heart attack  Father   . Stroke Father   . Hypertension Father   . Diabetes Father   . Heart attack Paternal Uncle   . Anxiety disorder Mother   . Cancer Mother        breast  . Dementia Paternal Grandmother     Social History Social History   Tobacco Use  . Smoking status: Current Every Day Smoker    Packs/day: 0.50  . Smokeless tobacco: Never Used  Vaping Use  . Vaping Use: Some days  Substance Use Topics  . Alcohol use: No  . Drug use: No     Allergies   Doxycycline   Review of Systems Review of Systems  See HPI Physical Exam Triage Vital Signs  ED Triage Vitals  Enc Vitals Group     BP 06/09/20 1403 (!) 167/114     Pulse Rate 06/09/20 1404 85     Resp 06/09/20 1404 20     Temp 06/09/20 1404 99.2 F (37.3 C)     Temp Source 06/09/20 1404 Oral     SpO2 06/09/20 1404 98 %     Weight 06/09/20 1405 245 lb (111.1 kg)     Height 06/09/20 1405 5\' 8"  (1.727 m)     Head Circumference --      Peak Flow --      Pain Score 06/09/20 1403 6     Pain Loc --      Pain Edu? --      Excl. in GC? --    No data found.  Updated Vital Signs BP (!) 157/108 (BP Location: Right Arm)   Pulse 85   Temp 99.2 F (37.3 C) (Oral)   Resp 20   Ht 5\' 8"  (1.727 m)   Wt 111.1 kg   SpO2 98%   BMI 37.25 kg/m      Physical Exam Constitutional:      General: He is not in acute distress.    Appearance: He is well-developed.     Comments: MildModerately ill.  Appears tired.   overweight  HENT:     Head: Normocephalic and atraumatic.     Right Ear: Tympanic membrane and ear canal normal.     Left Ear: Tympanic membrane and ear canal normal.     Nose: Congestion present.     Mouth/Throat:     Mouth: Mucous membranes are moist.     Pharynx: Posterior oropharyngeal erythema present.  Eyes:     Conjunctiva/sclera: Conjunctivae normal.     Pupils: Pupils are equal, round, and reactive to light.  Cardiovascular:     Rate and Rhythm: Normal rate and regular rhythm.     Heart sounds: Normal  heart sounds.  Pulmonary:     Effort: Pulmonary effort is normal. No respiratory distress.     Breath sounds: Normal breath sounds.  Abdominal:     General: There is no distension.     Palpations: Abdomen is soft.  Musculoskeletal:        General: Normal range of motion.     Cervical back: Normal range of motion.  Lymphadenopathy:     Cervical: Cervical adenopathy present.  Skin:    General: Skin is warm and dry.  Neurological:     Mental Status: He is alert.  Psychiatric:        Behavior: Behavior normal.      UC Treatments / Results  Labs (all labs ordered are listed, but only abnormal results are displayed) Labs Reviewed - No data to display  EKG   Radiology No results found.  Procedures Procedures (including critical care time)  Medications Ordered in UC Medications - No data to display  Initial Impression / Assessment and Plan / UC Course  I have reviewed the triage vital signs and the nursing notes.  Pertinent labs & imaging results that were available during my care of the patient were reviewed by me and considered in my medical decision making (see chart for details).     Concern is that he said symptoms for the better part of 3 weeks.  We will treat with both prednisone and antibiotics.  He should follow-up with his primary care if not improving in a few days Final Clinical Impressions(s) / UC  Diagnoses   Final diagnoses:  LRTI (lower respiratory tract infection)     Discharge Instructions     Continue with your nasal sprays Flonase and astelazine Take antibiotic 2 times a day Take prednisone 2 times a day Drink lots of fluids I expect improvement in 2 to 3 days    ED Prescriptions    Medication Sig Dispense Auth. Provider   predniSONE (DELTASONE) 20 MG tablet Take 1 tablet (20 mg total) by mouth 2 (two) times daily with a meal. 10 tablet Eustace Moore, MD   amoxicillin-clavulanate (AUGMENTIN) 875-125 MG tablet Take 1 tablet by mouth  every 12 (twelve) hours. 14 tablet Eustace Moore, MD     PDMP not reviewed this encounter.   Eustace Moore, MD 06/09/20 [phone]

## 2020-06-09 NOTE — ED Triage Notes (Signed)
Pt presents to Urgent Care with c/o nasal congestion, cough, and generalized body aches--particularly back pain--approx 1 week ago. Reports possible sinus infection 2-3 weeks ago which was partially resolved w/ Prednisone. Reports negative home COVID test; has not been vaccinated against COVID or flu. Daughter had flu this week and wife has bronchitis.

## 2020-06-09 NOTE — Discharge Instructions (Addendum)
Continue with your nasal sprays Flonase and astelazine Take antibiotic 2 times a day Take prednisone 2 times a day Drink lots of fluids I expect improvement in 2 to 3 days

## 2021-02-25 ENCOUNTER — Emergency Department (INDEPENDENT_AMBULATORY_CARE_PROVIDER_SITE_OTHER)
Admission: RE | Admit: 2021-02-25 | Discharge: 2021-02-25 | Disposition: A | Discharge status: Home / Self Care | Payer: 59 | Source: Ambulatory Visit | Attending: Family Medicine | Admitting: Family Medicine

## 2021-02-25 ENCOUNTER — Other Ambulatory Visit: Payer: Self-pay

## 2021-02-25 VITALS — BP 142/83 | HR 89 | Temp 98.6°F | Resp 18 | Ht 68.0 in | Wt 240.0 lb

## 2021-02-25 DIAGNOSIS — R059 Cough, unspecified: Secondary | ICD-10-CM | POA: Diagnosis not present

## 2021-02-25 DIAGNOSIS — U071 COVID-19: Secondary | ICD-10-CM | POA: Diagnosis not present

## 2021-02-25 MED ORDER — PREDNISONE 20 MG PO TABS: ORAL_TABLET | ORAL | 0 refills | Status: DC

## 2021-02-25 MED ORDER — AMOXICILLIN-POT CLAVULANATE 875-125 MG PO TABS: 1.0000 | ORAL_TABLET | Freq: Two times a day (BID) | ORAL | 0 refills | Status: DC

## 2021-02-25 NOTE — Discharge Instructions (Addendum)
Encouraged patient to stay self quarantine for the next 5 days or until 03/02/2021.  Advised patient take medication as directed with food to completion.  Advised patient to take Prednisone with first dose of Augmentin for the next 5 of 7 days.  Encouraged patient to increase daily water intake while taking this medication.

## 2021-02-25 NOTE — ED Triage Notes (Signed)
Pt states that he has a deep cough and some fatigue. X5 days  Pt states that he had a positive covid test x5 days ago.  Pt states that he isn't vaccinated for covid.  Pt states that he hasn't had flu vaccine.

## 2021-02-25 NOTE — ED Provider Notes (Signed)
[facility]    CSN: [id number] Arrival date & time: 02/25/21  1350      History   Chief Complaint Chief Complaint  Patient presents with   Cough    Cough, and fatigue. X5 days    HPI Douglas Vazquez is a 40 y.o. male.   HPI 40 year old male presents with cough and fatigue for 5 days.  Patient reports had positive home COVID test 5 days ago.  Reports he is not vaccinated for COVID-19 or influenza.  PMH significant for HTN, obesity, sleep apnea and fatigue.  Past Medical History:  Diagnosis Date   Allergy    Anxiety    Ear congestion, bilateral 03/08/2017   Last Assessment & Plan:  Patient notes recurrent infections in the past. States he has a congestion/fluid-like feeling in the [address] today. States that his been going on for a couple weeks. Notes that it is concurrent with increase in fatigue. Denies any true pain. Denies any fever. No recent upper respiratory infection symptoms. Physical exam does not reveal any signs of infection or fluid behind    Essential hypertension 06/28/2014   Fatigue 03/08/2017   Last Assessment & Plan:  Patient notices new onset of fatigue 4 weeks ago. He states that there is no changes in his routine. He denies any changes in medication or diet. No evidence of active bleeding. Does note that he is noncompliant with his CPAP machine. Advised that we would do blood work today. Patient refused and states that he would like to repeat in 4 weeks as he has not had anything to    Herniated disc    Hypertension    Insomnia 03/08/2017   Last Assessment & Plan:  Patient notes some difficulty sleeping when he is trying to wear his CPAP machine. He states that also he has a little bit of restlessness at night. Is inquiring for a sleep aid to help him with these symptoms. Has tried melatonin in the past. Will prescribe Rozerem 8 mg tablets as a starting point to help with difficulty sleeping, in order to try and avoid increasing fati   Non-cardiac chest pain     Numbness and tingling in right hand 03/08/2017   Last Assessment & Plan:  Occurs mostly at night. Noticing symptoms are worsening. Notices some tingling and numbness in the right hand. Has not used any treatments thus far. Advised to use a wrist brace at night to help with likely carpal tunnel-like symptoms. Advised to follow up in 4 weeks.   Obese    Obesity (BMI 35.0-39.9 without comorbidity) 06/25/2015   OSA (obstructive sleep apnea) 09/30/2015   Overview:  08/2015  HSAT  AHI 21.4  Sat 80 % Average Sat 92%  Last Assessment & Plan:  Patient previously diagnosed. Noncompliant with CPAP at this time. Expereincing increased fatigue. Notes excessive daytime tiredness. States that he has difficulty sleeping with CPAP mask. Is recommended to wear the full mask as he sleeps with his mouth open. Has not been back to see a sleep doctor recently. [place]   Psoriasis 06/28/2014   Under care of dermatologist, Dr Redmond Pulling    Rhinitis, allergic 06/28/2014   Smoker    Tobacco use 04/09/2017   Last Assessment & Plan:  Smoking cessation desired. Previously failed chantix due to irritability. > 1PPD. 21 pack-year history. Will start bupropion 150mg  for 3 days and increase to 300mg . Patient reports readiness for quitting. Offered talk therapy referral. [place] as well. Patient will try medications.  Advised to pick a quit date, use other healthy habits to replace oral fixation during    Patient Active Problem List   Diagnosis Date Noted   Tobacco use 04/09/2017   Ear congestion, bilateral 03/08/2017   Fatigue 03/08/2017   Insomnia 03/08/2017   Numbness and tingling in right hand 03/08/2017   OSA (obstructive sleep apnea) 09/30/2015   Obesity (BMI 35.0-39.9 without comorbidity) 06/25/2015   Rhinitis, allergic 06/28/2014   Psoriasis 06/28/2014   Essential hypertension 06/28/2014    Past Surgical History:  Procedure Laterality Date   TENDON REPAIR Right    R index finger tendon repair        Home  Medications    Prior to Admission medications   Medication Sig Start Date End Date Taking? Authorizing Provider  amLODipine (NORVASC) 10 MG tablet Take 10 mg by mouth daily. 12/14/20  Yes [provider]  amoxicillin-clavulanate (AUGMENTIN) 875-125 MG tablet Take 1 tablet by mouth every 12 (twelve) hours. 02/25/21  Yes Trevor Iha, FNP  benazepril (LOTENSIN) 10 MG tablet Take 10 mg by mouth daily.   Yes [provider]  hydrochlorothiazide (MICROZIDE) 12.5 MG capsule Take 12.5 mg by mouth daily. 05/25/20  Yes [provider]  Oxycodone HCl 10 MG TABS SMARTSIG:1 Tablet(s) By Mouth 6 Times Daily PRN 02/08/21  Yes [provider]  predniSONE (DELTASONE) 20 MG tablet Take 4 tabs PO daily x 5 days. 02/25/21  Yes Trevor Iha, FNP  cetirizine (ZYRTEC) 10 MG tablet Take 10 mg by mouth daily.    [provider]  nitroGLYCERIN (NITROSTAT) 0.4 MG SL tablet Place 1 tablet (0.4 mg total) under the tongue every 5 (five) minutes as needed for chest pain. 05/13/17 08/11/17  Baldo Daub, MD  eplerenone (INSPRA) 50 MG tablet Take 1 tablet (50 mg total) by mouth daily. 05/13/17 07/25/19  Baldo Daub, MD  metoprolol tartrate (LOPRESSOR) 50 MG tablet Take 1 tablet (50 mg total) by mouth once for 1 dose. Take 1 hour before cardiac CTA. 05/13/17 07/25/19  Baldo Daub, MD    Family History Family History  Problem Relation Age of Onset   Heart attack Father    Stroke Father    Hypertension Father    Diabetes Father    Heart attack Paternal Uncle    Anxiety disorder Mother    Cancer Mother        breast   Dementia Paternal Grandmother     Social History Social History   Tobacco Use   Smoking status: Every Day    Packs/day: 1.00    Types: Cigarettes   Smokeless tobacco: Never  Vaping Use   Vaping Use: Some days  Substance Use Topics   Alcohol use: No   Drug use: No     Allergies   Doxycycline   Review of Systems Review of Systems   Respiratory:  Positive for cough.   All other systems reviewed and are negative.   Physical Exam Triage Vital Signs ED Triage Vitals  Enc Vitals Group     BP 02/25/21 1431 (!) 142/83     Pulse Rate 02/25/21 1431 89     Resp 02/25/21 1431 18     Temp 02/25/21 1431 98.6 F (37 C)     Temp Source 02/25/21 1431 Oral     SpO2 02/25/21 1431 96 %     Weight 02/25/21 1428 240 lb (108.9 kg)     Height 02/25/21 1428 5\' 8"  (1.727 m)  Head Circumference --      Peak Flow --      Pain Score 02/25/21 1428 3     Pain Loc --      Pain Edu? --      Excl. in [place]? --    No data found.  Updated Vital Signs BP (!) 142/83 (BP Location: Left Arm)    Pulse 89    Temp 98.6 F (37 C) (Oral)    Resp 18    Ht 5\' 8"  (1.727 m)    Wt 240 lb (108.9 kg)    SpO2 96%    BMI 36.49 kg/m       Physical Exam Vitals and nursing note reviewed.  Constitutional:      General: He is not in acute distress.    Appearance: He is obese. He is not ill-appearing.  HENT:     Right Ear: Tympanic membrane, ear canal and external ear normal.     Left Ear: Tympanic membrane, ear canal and external ear normal.     Mouth/Throat:     Mouth: Mucous membranes are moist.     Pharynx: Oropharynx is clear.  Eyes:     Extraocular Movements: Extraocular movements intact.     Conjunctiva/sclera: Conjunctivae normal.     Pupils: Pupils are equal, round, and reactive to light.  Cardiovascular:     Rate and Rhythm: Normal rate and regular rhythm.     Pulses: Normal pulses.     Heart sounds: Normal heart sounds.  Pulmonary:     Effort: Pulmonary effort is normal.     Breath sounds: Wheezing and rhonchi present.     Comments: Infrequent nonproductive cough noted on exam Skin:    General: Skin is warm and dry.  Neurological:     General: No focal deficit present.     Mental Status: He is alert and oriented to person, place, and time.     UC Treatments / Results  Labs (all labs ordered are listed, but only abnormal  results are displayed) Labs Reviewed - No data to display  EKG   Radiology No results found.  Procedures Procedures (including critical care time)  Medications Ordered in UC Medications - No data to display  Initial Impression / Assessment and Plan / UC Course  I have reviewed the triage vital signs and the nursing notes.  Pertinent labs & imaging results that were available during my care of the patient were reviewed by me and considered in my medical decision making (see chart for details).     MDM: 1.  COVID-19-encouraged patient to stay self quarantine for the next 5 days or until 03/02/2021 work note backdated from day of positive test (12/22//22-03/02/2021) per patient request; 2.  Cough Rx'd Augmentin and Deltasone. Encouraged patient to stay self quarantine for the next 5 days or until 03/02/2021.  Advised patient take medication as directed with food to completion.  Advised patient to take Prednisone with first dose of Augmentin for the next 5 of 7 days.  Encouraged patient to increase daily water intake while taking this medication.  Patient discharged home, hemodynamically stable. Final Clinical Impressions(s) / UC Diagnoses   Final diagnoses:  COVID-19  Cough, unspecified type     Discharge Instructions      Encouraged patient to stay self quarantine for the next 5 days or until 03/02/2021.  Advised patient take medication as directed with food to completion.  Advised patient to take Prednisone with first dose of Augmentin for  the next 5 of 7 days.  Encouraged patient to increase daily water intake while taking this medication.     ED Prescriptions     Medication Sig Dispense Auth. Provider   amoxicillin-clavulanate (AUGMENTIN) 875-125 MG tablet Take 1 tablet by mouth every 12 (twelve) hours. 14 tablet Eliezer Lofts, FNP   predniSONE (DELTASONE) 20 MG tablet Take 4 tabs PO daily x 5 days. 20 tablet Eliezer Lofts, FNP      PDMP not reviewed this encounter.    Eliezer Lofts, [place] 02/25/21 [phone]

## 2021-05-05 ENCOUNTER — Telehealth: Payer: 59 | Admitting: Physician Assistant

## 2021-05-05 DIAGNOSIS — K649 Unspecified hemorrhoids: Secondary | ICD-10-CM

## 2021-05-05 MED ORDER — HYDROCORTISONE ACETATE 25 MG RE SUPP: 25.0000 mg | Freq: Two times a day (BID) | RECTAL | 0 refills | Status: DC

## 2021-05-05 NOTE — Progress Notes (Signed)

## 2021-07-24 ENCOUNTER — Other Ambulatory Visit (HOSPITAL_BASED_OUTPATIENT_CLINIC_OR_DEPARTMENT_OTHER): Payer: Self-pay

## 2021-07-24 MED ORDER — OXYCODONE HCL 10 MG PO TABS
10.0000 mg | ORAL_TABLET | Freq: Every day | ORAL | 0 refills | Status: DC
  Filled 2021-07-24: qty 168, 28d supply, fill #0

## 2021-08-20 ENCOUNTER — Other Ambulatory Visit (HOSPITAL_BASED_OUTPATIENT_CLINIC_OR_DEPARTMENT_OTHER): Payer: Self-pay

## 2021-08-20 MED ORDER — OXYCODONE HCL 10 MG PO TABS
ORAL_TABLET | ORAL | 0 refills | Status: DC
  Filled 2021-08-20: qty 168, 28d supply, fill #0

## 2021-09-17 ENCOUNTER — Other Ambulatory Visit (HOSPITAL_BASED_OUTPATIENT_CLINIC_OR_DEPARTMENT_OTHER): Payer: Self-pay

## 2021-09-17 MED ORDER — OXYCODONE HCL 10 MG PO TABS
ORAL_TABLET | ORAL | 0 refills | Status: DC
  Filled 2021-09-17: qty 168, 28d supply, fill #0

## 2021-10-14 ENCOUNTER — Other Ambulatory Visit (HOSPITAL_BASED_OUTPATIENT_CLINIC_OR_DEPARTMENT_OTHER): Payer: Self-pay

## 2021-10-14 MED ORDER — OXYCODONE HCL 10 MG PO TABS
ORAL_TABLET | ORAL | 0 refills | Status: DC
  Filled 2021-10-14: qty 168, 28d supply, fill #0

## 2021-10-17 ENCOUNTER — Other Ambulatory Visit (HOSPITAL_BASED_OUTPATIENT_CLINIC_OR_DEPARTMENT_OTHER): Payer: Self-pay

## 2021-10-17 MED ORDER — MELOXICAM 15 MG PO TABS
ORAL_TABLET | ORAL | 0 refills | Status: DC
  Filled 2021-10-17: qty 14, 14d supply, fill #0

## 2021-10-19 ENCOUNTER — Emergency Department (HOSPITAL_BASED_OUTPATIENT_CLINIC_OR_DEPARTMENT_OTHER): Payer: Self-pay

## 2021-10-19 ENCOUNTER — Emergency Department (HOSPITAL_BASED_OUTPATIENT_CLINIC_OR_DEPARTMENT_OTHER)
Admission: EM | Admit: 2021-10-19 | Discharge: 2021-10-19 | Disposition: A | Discharge status: Home / Self Care | Payer: Self-pay | Attending: Emergency Medicine | Admitting: Emergency Medicine

## 2021-10-19 ENCOUNTER — Encounter (HOSPITAL_BASED_OUTPATIENT_CLINIC_OR_DEPARTMENT_OTHER): Payer: Self-pay | Admitting: Emergency Medicine

## 2021-10-19 DIAGNOSIS — M25474 Effusion, right foot: Secondary | ICD-10-CM | POA: Insufficient documentation

## 2021-10-19 DIAGNOSIS — M2540 Effusion, unspecified joint: Secondary | ICD-10-CM

## 2021-10-19 DIAGNOSIS — M25432 Effusion, left wrist: Secondary | ICD-10-CM | POA: Insufficient documentation

## 2021-10-19 MED ORDER — METHYLPREDNISOLONE 4 MG PO TBPK: ORAL_TABLET | ORAL | 0 refills | Status: DC

## 2021-10-19 NOTE — ED Triage Notes (Addendum)
Pt reports R ankle and L wrist swelling for the past few weeks. No known injury. Also had R wrist swelling at one point, but none today. No swelling elsewhere.  Pt did not take his BP medications last night.

## 2021-10-19 NOTE — Discharge Instructions (Addendum)
It was a pleasure taking care of you!   Your x-ray was negative for fracture or dislocation.  You may continue taking the prescription meloxicam as prescribed to you.  You will be sent upper prescription for Medrol Dosepak, take as prescribed.  You may take over-the-counter Tylenol milligrams Tylenol every 6 hours as needed for pain.  Continue to ice the area up to 15 minutes at a time, ensure to place a barrier between your skin and the ice.  Ensure that you follow-up with your primary care provider regarding your elevated blood pressure, and sure to take your medications as prescribed.  Return to the emergency department if you are experiencing increasing/worsening pain, swelling, worsening symptoms.  ou may take over the counter 600 mg Ibuprofen every 6 hours or 500 mg Tylenol every 6 hours as needed for pain for no more than 7 days. You may apply ice or heat to affected area for up to 15 minutes at a time. Ensure to place a barrier between your skin and the ice/heat.  You may follow-up with your primary care provider as needed.  Return to the Emergency Department if you are experiencing increasing/worsening pain, swelling, color change, fever, or worsening symptoms.

## 2021-10-19 NOTE — ED Provider Notes (Signed)
[facility] EMERGENCY DEPARTMENT Provider Note   CSN: [id number] Arrival date & time: 10/19/21  1744     History  Chief Complaint  Patient presents with   Joint Swelling    Douglas Vazquez is a 41 y.o. male swelling to left wrist, right foot/ankle onset 6 weeks.  Does not have a primary care provider at this time.  Has a follow-up appointment with a new primary care provider on 10/30/2021.  No additional medications taken.  Denies fever.   Per patient chart review, patient was evaluated at [facility] ago and had negative x-rays of the right foot and ankle.  At that time patient was sent home with a prescription for meloxicam.  The history is provided by the patient. No language interpreter was used.       Home Medications Prior to Admission medications   Medication Sig Start Date End Date Taking? Authorizing Provider  methylPREDNISolone (MEDROL DOSEPAK) 4 MG TBPK tablet Take as directed 10/19/21  Yes Nyles Mitton A, PA-C  amLODipine (NORVASC) 10 MG tablet Take 10 mg by mouth daily. 12/14/20   [provider]  benazepril (LOTENSIN) 10 MG tablet Take 10 mg by mouth daily.    [provider]  cetirizine (ZYRTEC) 10 MG tablet Take 10 mg by mouth daily.    [provider]  hydrochlorothiazide (MICROZIDE) 12.5 MG capsule Take 12.5 mg by mouth daily. 05/25/20   [provider]  hydrocortisone (ANUSOL-HC) 25 MG suppository Place 1 suppository (25 mg total) rectally 2 (two) times daily. 05/05/21   Margaretann Loveless, PA-C  meloxicam (MOBIC) 15 MG tablet Take 1 tablet (15 mg total) by mouth 1 (one) time each day for 14 days. 10/17/21     nitroGLYCERIN (NITROSTAT) 0.4 MG SL tablet Place 1 tablet (0.4 mg total) under the tongue every 5 (five) minutes as needed for chest pain. 05/13/17 08/11/17  Baldo Daub, MD  Oxycodone HCl 10 MG TABS SMARTSIG:1 Tablet(s) By Mouth 6 Times Daily PRN 02/08/21   [provider]   Oxycodone HCl 10 MG TABS Take 1 (one) Tablet by mouth 6 times daily, as needed 10/14/21     eplerenone (INSPRA) 50 MG tablet Take 1 tablet (50 mg total) by mouth daily. 05/13/17 07/25/19  Baldo Daub, MD  metoprolol tartrate (LOPRESSOR) 50 MG tablet Take 1 tablet (50 mg total) by mouth once for 1 dose. Take 1 hour before cardiac CTA. 05/13/17 07/25/19  Baldo Daub, MD      Allergies    Doxycycline    Review of Systems   Review of Systems  Constitutional:  Negative for fever.  Musculoskeletal:  Positive for arthralgias and joint swelling.  Skin:  Negative for color change and wound.  All other systems reviewed and are negative.   Physical Exam Updated Vital Signs BP (!) 184/129   Pulse (!) 106   Temp 98.4 F (36.9 C) (Oral)   Resp 15   SpO2 98%  Physical Exam Vitals and nursing note reviewed.  Constitutional:      General: He is not in acute distress.    Appearance: Normal appearance.  Eyes:     General: No scleral icterus.    Extraocular Movements: Extraocular movements intact.  Cardiovascular:     Rate and Rhythm: Normal rate.  Pulmonary:     Effort: Pulmonary effort is normal. No respiratory distress.  Musculoskeletal:     Cervical back: Neck supple.     Comments:  No tenderness to palpation noted to left wrist, mild tenderness to palpation noted to left fourth and fifth metacarpal without overlying skin changes.  Mild tenderness to palpation noted to right fifth metatarsal without overlying skin changes.  Able to ambulate without assistance or difficulty.  Full active range of motion of all extremities.  Skin:    General: Skin is warm and dry.     Findings: No bruising, erythema or rash.  Neurological:     Mental Status: He is alert.  Psychiatric:        Behavior: Behavior normal.     ED Results / Procedures / Treatments   Labs (all labs ordered are listed, but only abnormal results are displayed) Labs Reviewed - No data to  display  EKG None  Radiology DG Wrist Complete Left  Result Date: 10/19/2021 CLINICAL DATA:  Left wrist pain EXAM: LEFT WRIST - COMPLETE 3+ VIEW COMPARISON:  None Available. FINDINGS: There is no evidence of fracture or dislocation. There is no evidence of arthropathy or other focal bone abnormality. Mild soft tissue swelling dorsal to the carpus. IMPRESSION: Mild soft tissue swelling. No fracture or dislocation. Electronically Signed   By: Helyn Numbers M.D.   On: 10/19/2021 19:31    Procedures Procedures    Medications Ordered in ED Medications - No data to display  ED Course/ Medical Decision Making/ A&P                           Medical Decision Making Amount and/or Complexity of Data Reviewed Radiology: ordered.  Risk Prescription drug management.   Patient with left wrist, right foot/ankle pain/swelling onset 6 weeks.  Has tried conservative therapy at home.  Patient afebrile.  On exam patient with No tenderness to palpation noted to left wrist, mild tenderness to palpation noted to left fourth and fifth metacarpal without overlying skin changes.  Mild tenderness to palpation noted to right fifth metatarsal without overlying skin changes.  Able to ambulate without assistance or difficulty.  Full active range of motion of all extremities. Differential diagnosis includes fracture, dislocation, contusion, sprain.    Additional history obtained:  External records from outside source obtained and reviewed including: [facility] at [facility] and had negative x-rays of the right foot and ankle.  Was sent a prescription for meloxicam.    Imaging: I ordered imaging studies including left wrist x-ray I independently visualized and interpreted imaging which showed: Negative for fracture or dislocation I agree with the radiologist interpretation    Disposition: Presentation suspicious for joint swelling.  Doubt fracture, dislocation, contusion at this time.   Doubt sprain at this time. After consideration of the diagnostic results and the patients response to treatment, I feel that the patient would benefit from Discharge home.  Patient provided with a prescription for Medrol Dosepak.  Instructed to maintain follow-up appointment with a new primary care provider on 10/30/2021.  Supportive care measures and strict return precautions discussed with patient at bedside. Pt acknowledges and verbalizes understanding. Pt appears safe for discharge. Follow up as indicated in discharge paperwork.    This chart was dictated using voice recognition software, Dragon. Despite the best efforts of this provider to proofread and correct errors, errors may still occur which can change documentation meaning.   Final Clinical Impression(s) / ED Diagnoses Final diagnoses:  Joint swelling    Rx / DC Orders ED Discharge Orders  Ordered    methylPREDNISolone (MEDROL DOSEPAK) 4 MG TBPK tablet        10/19/21 1947              Trudie Cervantes A, PA-C 10/19/21 1950    Virgina Norfolk, DO 10/19/21 2111

## 2021-10-30 ENCOUNTER — Ambulatory Visit: Payer: 59 | Admitting: Family Medicine

## 2021-10-30 ENCOUNTER — Encounter: Payer: Self-pay | Admitting: Family Medicine

## 2021-10-30 ENCOUNTER — Ambulatory Visit: Payer: Self-pay | Admitting: Family Medicine

## 2021-10-30 VITALS — BP 180/118 | HR 96 | Temp 98.3°F | Ht 68.0 in | Wt 243.6 lb

## 2021-10-30 DIAGNOSIS — M25571 Pain in right ankle and joints of right foot: Secondary | ICD-10-CM | POA: Diagnosis not present

## 2021-10-30 DIAGNOSIS — Z0000 Encounter for general adult medical examination without abnormal findings: Secondary | ICD-10-CM

## 2021-10-30 DIAGNOSIS — I10 Essential (primary) hypertension: Secondary | ICD-10-CM

## 2021-10-30 DIAGNOSIS — M545 Low back pain, unspecified: Secondary | ICD-10-CM | POA: Diagnosis not present

## 2021-10-30 DIAGNOSIS — Z720 Tobacco use: Secondary | ICD-10-CM

## 2021-10-30 DIAGNOSIS — G8929 Other chronic pain: Secondary | ICD-10-CM

## 2021-10-30 LAB — URINALYSIS, ROUTINE W REFLEX MICROSCOPIC
Bilirubin Urine: NEGATIVE
Hgb urine dipstick: NEGATIVE
Ketones, ur: NEGATIVE
Leukocytes,Ua: NEGATIVE
Nitrite: NEGATIVE
RBC / HPF: NONE SEEN (ref 0–?)
Specific Gravity, Urine: 1.025 (ref 1.000–1.030)
Total Protein, Urine: NEGATIVE
Urine Glucose: NEGATIVE
Urobilinogen, UA: 0.2 (ref 0.0–1.0)
WBC, UA: NONE SEEN (ref 0–?)
pH: 6 (ref 5.0–8.0)

## 2021-10-30 LAB — HEMOGLOBIN A1C: Hgb A1c MFr Bld: 5.8 % (ref 4.6–6.5)

## 2021-10-30 LAB — COMPREHENSIVE METABOLIC PANEL
ALT: 20 U/L (ref 0–53)
AST: 15 U/L (ref 0–37)
Albumin: 3.8 g/dL (ref 3.5–5.2)
Alkaline Phosphatase: 104 U/L (ref 39–117)
BUN: 9 mg/dL (ref 6–23)
CO2: 28 mEq/L (ref 19–32)
Calcium: 9 mg/dL (ref 8.4–10.5)
Chloride: 105 mEq/L (ref 96–112)
Creatinine, Ser: 0.88 mg/dL (ref 0.40–1.50)
GFR: 106.1 mL/min (ref >60.00)
Glucose, Bld: 82 mg/dL (ref 70–99)
Potassium: 3.7 mEq/L (ref 3.5–5.1)
Sodium: 140 mEq/L (ref 135–145)
Total Bilirubin: 0.5 mg/dL (ref 0.2–1.2)
Total Protein: 6.9 g/dL (ref 6.0–8.3)

## 2021-10-30 LAB — LIPID PANEL
Cholesterol: 155 mg/dL (ref 0–200)
HDL: 45 mg/dL (ref >39.00)
LDL Cholesterol: 92 mg/dL (ref 0–99)
NonHDL: 110.46
Total CHOL/HDL Ratio: 3
Triglycerides: 92 mg/dL (ref 0.0–149.0)
VLDL: 18.4 mg/dL (ref 0.0–40.0)

## 2021-10-30 LAB — CBC
HCT: 46.9 % (ref 39.0–52.0)
Hemoglobin: 15.4 g/dL (ref 13.0–17.0)
MCHC: 32.9 g/dL (ref 30.0–36.0)
MCV: 86 fl (ref 78.0–100.0)
Platelets: 250 10*3/uL (ref 150.0–400.0)
RBC: 5.45 Mil/uL (ref 4.22–5.81)
RDW: 14.1 % (ref 11.5–15.5)
WBC: 11.9 10*3/uL — ABNORMAL HIGH (ref 4.0–10.5)

## 2021-10-30 LAB — URIC ACID: Uric Acid, Serum: 6.3 mg/dL (ref 4.0–7.8)

## 2021-10-30 LAB — LDL CHOLESTEROL, DIRECT: Direct LDL: 102 mg/dL

## 2021-10-30 NOTE — Progress Notes (Signed)
Established Patient Office Visit  Subjective   Patient ID: Douglas Vazquez, male    DOB: 01-24-1981  Age: 41 y.o. MRN: [id number]  Chief Complaint  Patient presents with   Establish Care    NP/establish care follow up on right ankle pain and swelling. Patient fasting.     HPI for establishment of care health check and follow-up of right ankle pain experienced 10 days ago.  Ankle became swollen and painful.  There was no injury history.  There was no fever or chills.  He was seen in urgent care.  X-rays were taken and found to be normal.  He has no family or personal history history of gout.  He does have a history of psoriasis.  History of hypertension treated with Benza Prill 10 mg, amlodipine 10 mg and HCTZ 12.5 mg.  He has not taken his blood pressure medicines today.  More distant history of chest pain.  He was referred to cardiology.  Stress echo was normal.  He tells me that a subsequent dye test was negative for any blockages.  He says that the cardiologist gave him nitroglycerin to use as needed.  He has not needed it.  He smokes a pack of cigarettes a day.  He does not drink alcohol or use illicit drugs.  He is in pain management for chronic lower back pain that sometimes resulted in paresthesias in his right leg.  He is active on his job but has no regular exercise.  He has no regular dental care but had lost insurance and is planning to start.    Review of Systems  Constitutional: Negative.   HENT: Negative.    Eyes:  Negative for blurred vision, discharge and redness.  Respiratory: Negative.    Cardiovascular: Negative.   Gastrointestinal:  Negative for abdominal pain.  Genitourinary: Negative.   Musculoskeletal:  Positive for back pain and joint pain. Negative for myalgias.  Skin:  Negative for rash.  Neurological:  Negative for dizziness, tingling, loss of consciousness, weakness and headaches.  Endo/Heme/Allergies:  Negative for polydipsia.      Objective:     BP  (!) 180/118 (BP Location: Right Arm, Patient Position: Sitting, Cuff Size: Large)   Pulse 96   Temp 98.3 F (36.8 C) (Temporal)   Ht 5\' 8"  (1.727 m)   Wt 243 lb 9.6 oz (110.5 kg)   SpO2 97%   BMI 37.04 kg/m  BP Readings from Last 3 Encounters:  10/30/21 (!) 180/118  10/19/21 (!) 168/108  02/25/21 (!) 142/83   Wt Readings from Last 3 Encounters:  10/30/21 243 lb 9.6 oz (110.5 kg)  02/25/21 240 lb (108.9 kg)  06/09/20 245 lb (111.1 kg)      Physical Exam Constitutional:      General: He is not in acute distress.    Appearance: Normal appearance. He is not ill-appearing, toxic-appearing or diaphoretic.  HENT:     Head: Normocephalic and atraumatic.     Right Ear: External ear normal.     Left Ear: External ear normal.     Mouth/Throat:     Mouth: Mucous membranes are moist.     Pharynx: Oropharynx is clear. No oropharyngeal exudate or posterior oropharyngeal erythema.  Eyes:     General: No scleral icterus.       Right eye: No discharge.        Left eye: No discharge.     Extraocular Movements: Extraocular movements intact.     Conjunctiva/sclera: Conjunctivae normal.  Pupils: Pupils are equal, round, and reactive to light.  Cardiovascular:     Rate and Rhythm: Normal rate and regular rhythm.  Pulmonary:     Effort: Pulmonary effort is normal. No respiratory distress.     Breath sounds: Normal breath sounds.  Abdominal:     General: Bowel sounds are normal.     Tenderness: There is no abdominal tenderness. There is no guarding.  Musculoskeletal:     Cervical back: No rigidity or tenderness.     Right ankle: Swelling present. No deformity.  Skin:    General: Skin is warm and dry.  Neurological:     Mental Status: He is alert and oriented to person, place, and time.  Psychiatric:        Mood and Affect: Mood normal.        Behavior: Behavior normal.      No results found for any visits on 10/30/21.    The ASCVD Risk score (Arnett DK, et al., 2019) failed  to calculate for the following reasons:   Cannot find a previous HDL lab    Assessment & Plan:   Problem List Items Addressed This Visit       Cardiovascular and Mediastinum   Essential hypertension - Primary   Relevant Orders   CBC   Comprehensive metabolic panel   Urinalysis, Routine w reflex microscopic     Other   Tobacco use   Chronic low back pain   Healthcare maintenance   Relevant Orders   LDL cholesterol, direct   Hemoglobin A1c   Lipid panel   Other Visit Diagnoses     Arthralgia of right ankle       Relevant Orders   CBC   Uric acid       Return in about 2 weeks (around 11/13/2021).  Routine health maintenance labs drawn today including a uric acid.  Would have to consider psoriatic arthritis.  Advised him to take his blood pressure medicines daily as directed and follow-up in 2 weeks for recheck.  Advised that he will need to see [facility] for pain management regarding his chronic back pain.  He agreed to this.  He is planning on seeking regular dental care.  Advised 30 minutes of exercise most days of the week.  Walking would be fine.  He will start this as soon as his ankle improves.  He is planning on quitting tobacco by tapering the number of cigarettes he smokes daily.  Mliss Sax, MD

## 2021-10-31 ENCOUNTER — Telehealth: Payer: Self-pay

## 2021-10-31 ENCOUNTER — Encounter: Payer: Self-pay | Admitting: Family Medicine

## 2021-10-31 DIAGNOSIS — L4050 Arthropathic psoriasis, unspecified: Secondary | ICD-10-CM

## 2021-10-31 NOTE — Telephone Encounter (Signed)
Spoke with patient went over lab results per patient he's still having lots of pain in his ankle not sure what the next steps are. Patient asking if he could have another round of Prednisone for the time being? Please advise.

## 2021-11-04 NOTE — Telephone Encounter (Signed)
Patient aware referral placed agrees to call back if he does not hear from someone to schedule appointment.

## 2021-11-04 NOTE — Addendum Note (Signed)
Addended by: Nadene Rubins A on: 11/04/2021 12:02 PM   Modules accepted: Orders

## 2021-11-06 ENCOUNTER — Other Ambulatory Visit (HOSPITAL_BASED_OUTPATIENT_CLINIC_OR_DEPARTMENT_OTHER): Payer: Self-pay

## 2021-11-06 MED ORDER — OXYCODONE HCL 10 MG PO TABS
10.0000 mg | ORAL_TABLET | Freq: Every day | ORAL | 0 refills | Status: DC
  Filled 2021-11-06 – 2021-11-10 (×2): qty 168, 28d supply, fill #0

## 2021-11-10 ENCOUNTER — Other Ambulatory Visit (HOSPITAL_BASED_OUTPATIENT_CLINIC_OR_DEPARTMENT_OTHER): Payer: Self-pay

## 2021-11-11 ENCOUNTER — Encounter: Payer: Self-pay | Admitting: Family Medicine

## 2021-11-11 NOTE — Telephone Encounter (Signed)
Caller Name: Dani Danis Call back phone #: [phone]  Reason for Call: Pt was referred to a rheumatologist that will not have an opening until December. He called around and was told by [facility] (fax#[phone]). They asked that we mark as urgent so that he could be fit in for next week.

## 2021-11-11 NOTE — Telephone Encounter (Signed)
Please advise message below patient asking if referral could be marked as urgent so he could be seen next week.

## 2021-11-12 ENCOUNTER — Other Ambulatory Visit: Payer: Self-pay

## 2021-11-12 DIAGNOSIS — L4050 Arthropathic psoriasis, unspecified: Secondary | ICD-10-CM

## 2021-11-20 ENCOUNTER — Ambulatory Visit: Payer: 59 | Admitting: Family Medicine

## 2021-11-20 ENCOUNTER — Other Ambulatory Visit (HOSPITAL_BASED_OUTPATIENT_CLINIC_OR_DEPARTMENT_OTHER): Payer: Self-pay

## 2021-11-20 DIAGNOSIS — M79672 Pain in left foot: Secondary | ICD-10-CM | POA: Diagnosis not present

## 2021-11-20 DIAGNOSIS — M25572 Pain in left ankle and joints of left foot: Secondary | ICD-10-CM | POA: Diagnosis not present

## 2021-11-20 DIAGNOSIS — M79643 Pain in unspecified hand: Secondary | ICD-10-CM | POA: Diagnosis not present

## 2021-11-20 DIAGNOSIS — M79642 Pain in left hand: Secondary | ICD-10-CM | POA: Diagnosis not present

## 2021-11-20 DIAGNOSIS — L409 Psoriasis, unspecified: Secondary | ICD-10-CM | POA: Diagnosis not present

## 2021-11-20 DIAGNOSIS — M79641 Pain in right hand: Secondary | ICD-10-CM | POA: Diagnosis not present

## 2021-11-20 DIAGNOSIS — M79671 Pain in right foot: Secondary | ICD-10-CM | POA: Diagnosis not present

## 2021-11-20 DIAGNOSIS — M25571 Pain in right ankle and joints of right foot: Secondary | ICD-10-CM | POA: Diagnosis not present

## 2021-11-20 DIAGNOSIS — L4050 Arthropathic psoriasis, unspecified: Secondary | ICD-10-CM | POA: Diagnosis not present

## 2021-11-20 MED ORDER — PREDNISONE 20 MG PO TABS
20.0000 mg | ORAL_TABLET | Freq: Two times a day (BID) | ORAL | 1 refills | Status: DC
  Filled 2021-11-20: qty 60, 30d supply, fill #0
  Filled 2022-06-12: qty 60, 30d supply, fill #1

## 2021-12-04 ENCOUNTER — Other Ambulatory Visit (HOSPITAL_BASED_OUTPATIENT_CLINIC_OR_DEPARTMENT_OTHER): Payer: Self-pay

## 2021-12-04 MED ORDER — OXYCODONE HCL 10 MG PO TABS
10.0000 mg | ORAL_TABLET | Freq: Every day | ORAL | 0 refills | Status: DC
  Filled 2021-12-04: qty 168, 28d supply, fill #0
  Filled 2021-12-08: qty 68, 11d supply, fill #0
  Filled 2021-12-08: qty 100, 17d supply, fill #0

## 2021-12-08 ENCOUNTER — Other Ambulatory Visit (HOSPITAL_BASED_OUTPATIENT_CLINIC_OR_DEPARTMENT_OTHER): Payer: Self-pay

## 2021-12-15 ENCOUNTER — Telehealth: Payer: 59 | Admitting: Family Medicine

## 2021-12-15 ENCOUNTER — Other Ambulatory Visit (HOSPITAL_BASED_OUTPATIENT_CLINIC_OR_DEPARTMENT_OTHER): Payer: Self-pay

## 2021-12-15 DIAGNOSIS — H00012 Hordeolum externum right lower eyelid: Secondary | ICD-10-CM | POA: Diagnosis not present

## 2021-12-15 MED ORDER — ERYTHROMYCIN 5 MG/GM OP OINT
1.0000 | TOPICAL_OINTMENT | Freq: Two times a day (BID) | OPHTHALMIC | 0 refills | Status: DC
  Filled 2021-12-15: qty 3.5, 10d supply, fill #0
  Filled 2022-03-24: qty 3.5, 2d supply, fill #0

## 2021-12-15 NOTE — Progress Notes (Signed)
  E-Visit for Stye   We are sorry that you are not feeling well. Here is how we plan to help!  Based on what you have shared with me it looks like you have a stye.  A stye is an inflammation of the eyelid.  It is often a red, painful lump near the edge of the eyelid that may look like a boil or a pimple.  A stye develops when an infection occurs at the base of an eyelash.   We have made appropriate suggestions for you based upon your presentation: Simple styes can be treated without medical intervention.  Most styes either resolve spontaneously or resolve with simple home treatment by applying warm compresses or heated washcloth to the stye for about 10-15 minutes three to four times a day. This causes the stye to drain and resolve. Given the duration you have had this I will prescribe an ointment to be used.  HOME CARE:  Wash your hands often! Let the stye open on its own. Don't squeeze or open it. Don't rub your eyes. This can irritate your eyes and let in bacteria.  If you need to touch your eyes, wash your hands first. Don't wear eye makeup or contact lenses until the area has healed.  GET HELP RIGHT AWAY IF:  Your symptoms do not improve. You develop blurred or loss of vision. Your symptoms worsen (increased discharge, pain or redness).   Thank you for choosing an e-visit.  Your e-visit answers were reviewed by a board certified advanced clinical practitioner to complete your personal care plan. Depending upon the condition, your plan could have included both over the counter or prescription medications.  Please review your pharmacy choice. Make sure the pharmacy is open so you can pick up prescription now. If there is a problem, you may contact your provider through [employer] and have the prescription routed to another pharmacy.  Your safety is important to [place]. If you have drug allergies check your prescription carefully.   For the next 24 hours you can use MyChart to ask  questions about today's visit, request a non-urgent call back, or ask for a work or school excuse. You will get an email in the next two days asking about your experience. I hope that your e-visit has been valuable and will speed your recovery.   I provided 5 minutes of non face-to-face time during this encounter for chart review, medication and order placement, as well as and documentation.

## 2021-12-17 ENCOUNTER — Other Ambulatory Visit (HOSPITAL_BASED_OUTPATIENT_CLINIC_OR_DEPARTMENT_OTHER): Payer: Self-pay

## 2021-12-17 MED ORDER — POLYMYXIN B-TRIMETHOPRIM 10000-0.1 UNIT/ML-% OP SOLN
1.0000 [drp] | OPHTHALMIC | 0 refills | Status: DC
  Filled 2021-12-17: qty 10, 34d supply, fill #0

## 2021-12-24 DIAGNOSIS — L409 Psoriasis, unspecified: Secondary | ICD-10-CM | POA: Diagnosis not present

## 2021-12-24 DIAGNOSIS — H15002 Unspecified scleritis, left eye: Secondary | ICD-10-CM | POA: Diagnosis not present

## 2021-12-24 DIAGNOSIS — L4050 Arthropathic psoriasis, unspecified: Secondary | ICD-10-CM | POA: Diagnosis not present

## 2021-12-24 DIAGNOSIS — Z79899 Other long term (current) drug therapy: Secondary | ICD-10-CM | POA: Diagnosis not present

## 2021-12-24 DIAGNOSIS — M549 Dorsalgia, unspecified: Secondary | ICD-10-CM | POA: Diagnosis not present

## 2022-01-01 ENCOUNTER — Other Ambulatory Visit (HOSPITAL_BASED_OUTPATIENT_CLINIC_OR_DEPARTMENT_OTHER): Payer: Self-pay

## 2022-01-01 ENCOUNTER — Other Ambulatory Visit: Payer: Self-pay | Admitting: Family Medicine

## 2022-01-01 MED ORDER — HYDROCHLOROTHIAZIDE 12.5 MG PO CAPS
12.5000 mg | ORAL_CAPSULE | Freq: Every day | ORAL | 1 refills | Status: DC
  Filled 2022-01-01: qty 90, 90d supply, fill #0

## 2022-01-01 MED ORDER — BENAZEPRIL HCL 40 MG PO TABS
40.0000 mg | ORAL_TABLET | Freq: Every day | ORAL | 1 refills | Status: AC
  Filled 2022-01-01: qty 90, 90d supply, fill #0
  Filled 2022-03-24: qty 90, 90d supply, fill #1

## 2022-01-01 MED ORDER — AMLODIPINE BESYLATE 10 MG PO TABS
10.0000 mg | ORAL_TABLET | Freq: Every day | ORAL | 1 refills | Status: DC
  Filled 2022-01-01: qty 30, 30d supply, fill #0
  Filled 2022-06-12: qty 30, 30d supply, fill #1

## 2022-01-01 MED ORDER — OXYCODONE HCL 10 MG PO TABS
10.0000 mg | ORAL_TABLET | Freq: Every day | ORAL | 0 refills | Status: DC
  Filled 2022-01-01: qty 12, 2d supply, fill #0
  Filled 2022-01-01: qty 156, 26d supply, fill #0

## 2022-01-02 ENCOUNTER — Other Ambulatory Visit (HOSPITAL_BASED_OUTPATIENT_CLINIC_OR_DEPARTMENT_OTHER): Payer: Self-pay

## 2022-01-02 MED ORDER — BENAZEPRIL HCL 10 MG PO TABS
10.0000 mg | ORAL_TABLET | Freq: Every day | ORAL | 0 refills | Status: DC
  Filled 2022-01-02 – 2022-03-24 (×2): qty 90, 90d supply, fill #0

## 2022-01-02 MED ORDER — HYDROCHLOROTHIAZIDE 12.5 MG PO CAPS
12.5000 mg | ORAL_CAPSULE | Freq: Every day | ORAL | 0 refills | Status: DC
  Filled 2022-01-02 – 2022-03-24 (×2): qty 90, 90d supply, fill #0

## 2022-01-02 MED ORDER — AMLODIPINE BESYLATE 10 MG PO TABS
10.0000 mg | ORAL_TABLET | Freq: Every day | ORAL | 0 refills | Status: DC
  Filled 2022-01-02 – 2022-03-24 (×2): qty 90, 90d supply, fill #0

## 2022-01-26 ENCOUNTER — Ambulatory Visit: Payer: 59 | Admitting: Internal Medicine

## 2022-01-28 ENCOUNTER — Other Ambulatory Visit (HOSPITAL_BASED_OUTPATIENT_CLINIC_OR_DEPARTMENT_OTHER): Payer: Self-pay

## 2022-01-28 ENCOUNTER — Telehealth: Payer: 59 | Admitting: Physician Assistant

## 2022-01-28 DIAGNOSIS — J069 Acute upper respiratory infection, unspecified: Secondary | ICD-10-CM | POA: Diagnosis not present

## 2022-01-28 MED ORDER — FLUTICASONE PROPIONATE 50 MCG/ACT NA SUSP
2.0000 | Freq: Every day | NASAL | 0 refills | Status: DC
  Filled 2022-01-28: qty 16, 30d supply, fill #0

## 2022-01-28 MED ORDER — BENZONATATE 100 MG PO CAPS
100.0000 mg | ORAL_CAPSULE | Freq: Three times a day (TID) | ORAL | 0 refills | Status: DC | PRN
  Filled 2022-01-28: qty 30, 10d supply, fill #0

## 2022-01-28 NOTE — Progress Notes (Signed)
E-Visit for Upper Respiratory Infection   We are sorry you are not feeling well.  Here is how we plan to help!  Based on what you have shared with me, it looks like you may have a viral upper respiratory infection.  Upper respiratory infections are caused by a large number of viruses; however, rhinovirus is the most common cause.   Symptoms vary from person to person, with common symptoms including sore throat, cough, fatigue or lack of energy and feeling of general discomfort.  A low-grade fever of up to 100.4 may present, but is often uncommon.  Symptoms vary however, and are closely related to a person's age or underlying illnesses.  The most common symptoms associated with an upper respiratory infection are nasal discharge or congestion, cough, sneezing, headache and pressure in the ears and face.  These symptoms usually persist for about 3 to 10 days, but can last up to 2 weeks.  It is important to know that upper respiratory infections do not cause serious illness or complications in most cases.    Upper respiratory infections can be transmitted from person to person, with the most common method of transmission being a person's hands.  The virus is able to live on the skin and can infect other persons for up to 2 hours after direct contact.  Also, these can be transmitted when someone coughs or sneezes; thus, it is important to cover the mouth to reduce this risk.  To keep the spread of the illness at bay, good hand hygiene is very important.  This is an infection that is most likely caused by a virus. There are no specific treatments other than to help you with the symptoms until the infection runs its course.  We are sorry you are not feeling well.  Here is how we plan to help!   For nasal congestion, you may use an oral decongestants such as Mucinex D or if you have glaucoma or high blood pressure use plain Mucinex.  Saline nasal spray or nasal drops can help and can safely be used as often as  needed for congestion.  For your congestion, I have prescribed Fluticasone nasal spray one spray in each nostril twice a day  If you do not have a history of heart disease, hypertension, diabetes or thyroid disease, prostate/bladder issues or glaucoma, you may also use Sudafed to treat nasal congestion.  It is highly recommended that you consult with a pharmacist or your primary care physician to ensure this medication is safe for you to take.     If you have a cough, you may use cough suppressants such as Delsym and Robitussin.  If you have glaucoma or high blood pressure, you can also use Coricidin HBP.   For cough I have prescribed for you A prescription cough medication called Tessalon Perles 100 mg. You may take 1-2 capsules every 8 hours as needed for cough  If you have a sore or scratchy throat, use a saltwater gargle-  to  teaspoon of salt dissolved in a 4-ounce to 8-ounce glass of warm water.  Gargle the solution for approximately 15-30 seconds and then spit.  It is important not to swallow the solution.  You can also use throat lozenges/cough drops and Chloraseptic spray to help with throat pain or discomfort.  Warm or cold liquids can also be helpful in relieving throat pain.  For headache, pain or general discomfort, you can use Ibuprofen or Tylenol as directed.   Some authorities believe   that zinc sprays or the use of Echinacea may shorten the course of your symptoms.   HOME CARE Only take medications as instructed by your medical team. Be sure to drink plenty of fluids. Water is fine as well as fruit juices, sodas and electrolyte beverages. You may want to stay away from caffeine or alcohol. If you are nauseated, try taking small sips of liquids. How do you know if you are getting enough fluid? Your urine should be a pale yellow or almost colorless. Get rest. Taking a steamy shower or using a humidifier may help nasal congestion and ease sore throat pain. You can place a towel over  your head and breathe in the steam from hot water coming from a faucet. Using a saline nasal spray works much the same way. Cough drops, hard candies and sore throat lozenges may ease your cough. Avoid close contacts especially the very young and the elderly Cover your mouth if you cough or sneeze Always remember to wash your hands.   GET HELP RIGHT AWAY IF: You develop worsening fever. If your symptoms do not improve within 10 days You develop yellow or green discharge from your nose over 3 days. You have coughing fits You develop a severe head ache or visual changes. You develop shortness of breath, difficulty breathing or start having chest pain Your symptoms persist after you have completed your treatment plan  MAKE SURE YOU  Understand these instructions. Will watch your condition. Will get help right away if you are not doing well or get worse.  Thank you for choosing an e-visit.  Your e-visit answers were reviewed by a board certified advanced clinical practitioner to complete your personal care plan. Depending upon the condition, your plan could have included both over the counter or prescription medications.  Please review your pharmacy choice. Make sure the pharmacy is open so you can pick up prescription now. If there is a problem, you may contact your provider through MyChart messaging and have the prescription routed to another pharmacy.  Your safety is important to us. If you have drug allergies check your prescription carefully.   For the next 24 hours you can use MyChart to ask questions about today's visit, request a non-urgent call back, or ask for a work or school excuse. You will get an email in the next two days asking about your experience. I hope that your e-visit has been valuable and will speed your recovery.   I have spent 5 minutes in review of e-visit questionnaire, review and updating patient chart, medical decision making and response to patient.    Inari Shin M Shakeela Rabadan, PA-C  

## 2022-01-29 ENCOUNTER — Other Ambulatory Visit (HOSPITAL_BASED_OUTPATIENT_CLINIC_OR_DEPARTMENT_OTHER): Payer: Self-pay

## 2022-01-29 MED ORDER — OXYCODONE HCL 10 MG PO TABS
10.0000 mg | ORAL_TABLET | Freq: Every day | ORAL | 0 refills | Status: DC
  Filled 2022-01-29: qty 168, 28d supply, fill #0

## 2022-02-05 ENCOUNTER — Other Ambulatory Visit (HOSPITAL_BASED_OUTPATIENT_CLINIC_OR_DEPARTMENT_OTHER): Payer: Self-pay

## 2022-02-10 ENCOUNTER — Other Ambulatory Visit (HOSPITAL_BASED_OUTPATIENT_CLINIC_OR_DEPARTMENT_OTHER): Payer: Self-pay

## 2022-02-21 ENCOUNTER — Telehealth: Payer: No Typology Code available for payment source | Admitting: Physician Assistant

## 2022-02-21 DIAGNOSIS — K047 Periapical abscess without sinus: Secondary | ICD-10-CM

## 2022-02-21 MED ORDER — PENICILLIN V POTASSIUM 500 MG PO TABS: 500.0000 mg | ORAL_TABLET | Freq: Three times a day (TID) | ORAL | 0 refills | Status: AC

## 2022-02-21 NOTE — Progress Notes (Signed)
E-Visit for Dental Pain  We are sorry that you are not feeling well.  Here is how we plan to help!  Based on what you have shared with me in the questionnaire, it sounds like you have a dental infection.  Pen VK 500mg 3 times a day for 7 days  It is imperative that you see a dentist within 10 days of this eVisit to determine the cause of the dental pain and be sure it is adequately treated  A toothache or tooth pain is caused when the nerve in the root of a tooth or surrounding a tooth is irritated. Dental (tooth) infection, decay, injury, or loss of a tooth are the most common causes of dental pain. Pain may also occur after an extraction (tooth is pulled out). Pain sometimes originates from other areas and radiates to the jaw, thus appearing to be tooth pain.Bacteria growing inside your mouth can contribute to gum disease and dental decay, both of which can cause pain. A toothache occurs from inflammation of the central portion of the tooth called pulp. The pulp contains nerve endings that are very sensitive to pain. Inflammation to the pulp or pulpitis may be caused by dental cavities, trauma, and infection.    HOME CARE:   For toothaches: Over-the-counter pain medications such as acetaminophen or ibuprofen may be used. Take these as directed on the package while you arrange for a dental appointment. Avoid very cold or hot foods, because they may make the pain worse. You may get relief from biting on a cotton ball soaked in oil of cloves. You can get oil of cloves at most drug stores.  For jaw pain:  Aspirin may be helpful for problems in the joint of the jaw in adults. If pain happens every time you open your mouth widely, the temporomandibular joint (TMJ) may be the source of the pain. Yawning or taking a large bite of food may worsen the pain. An appointment with your doctor or dentist will help you find the cause.     GET HELP RIGHT AWAY IF:  You have a high fever or chills If you  have had a recent head or face injury and develop headache, light headedness, nausea, vomiting, or other symptoms that concern you after an injury to your face or mouth, you could have a more serious injury in addition to your dental injury. A facial rash associated with a toothache: This condition may improve with medication. Contact your doctor for them to decide what is appropriate. Any jaw pain occurring with chest pain: Although jaw pain is most commonly caused by dental disease, it is sometimes referred pain from other areas. People with heart disease, especially people who have had stents placed, people with diabetes, or those who have had heart surgery may have jaw pain as a symptom of heart attack or angina. If your jaw or tooth pain is associated with lightheadedness, sweating, or shortness of breath, you should see a doctor as soon as possible. Trouble swallowing or excessive pain or bleeding from gums: If you have a history of a weakened immune system, diabetes, or steroid use, you may be more susceptible to infections. Infections can often be more severe and extensive or caused by unusual organisms. Dental and gum infections in people with these conditions may require more aggressive treatment. An abscess may need draining or IV antibiotics, for example.  MAKE SURE YOU   Understand these instructions. Will watch your condition. Will get help right away if   you are not doing well or get worse.  Thank you for choosing an e-visit.  Your e-visit answers were reviewed by a board certified advanced clinical practitioner to complete your personal care plan. Depending upon the condition, your plan could have included both over the counter or prescription medications.  Please review your pharmacy choice. Make sure the pharmacy is open so you can pick up prescription now. If there is a problem, you may contact your provider through MyChart messaging and have the prescription routed to another  pharmacy.  Your safety is important to us. If you have drug allergies check your prescription carefully.   For the next 24 hours you can use MyChart to ask questions about today's visit, request a non-urgent call back, or ask for a work or school excuse. You will get an email in the next two days asking about your experience. I hope that your e-visit has been valuable and will speed your recovery.  I have spent 5 minutes in review of e-visit questionnaire, review and updating patient chart, medical decision making and response to patient.   Meagan Ancona M Kada Friesen, PA-C  

## 2022-02-26 ENCOUNTER — Other Ambulatory Visit (HOSPITAL_BASED_OUTPATIENT_CLINIC_OR_DEPARTMENT_OTHER): Payer: Self-pay

## 2022-02-26 MED ORDER — OXYCODONE HCL 10 MG PO TABS
10.0000 mg | ORAL_TABLET | Freq: Every day | ORAL | 0 refills | Status: DC
  Filled 2022-02-26: qty 168, 28d supply, fill #0

## 2022-03-10 ENCOUNTER — Ambulatory Visit: Payer: Self-pay

## 2022-03-24 ENCOUNTER — Other Ambulatory Visit (HOSPITAL_BASED_OUTPATIENT_CLINIC_OR_DEPARTMENT_OTHER): Payer: Self-pay

## 2022-03-24 DIAGNOSIS — L409 Psoriasis, unspecified: Secondary | ICD-10-CM | POA: Diagnosis not present

## 2022-03-24 DIAGNOSIS — Z79899 Other long term (current) drug therapy: Secondary | ICD-10-CM | POA: Diagnosis not present

## 2022-03-24 DIAGNOSIS — M549 Dorsalgia, unspecified: Secondary | ICD-10-CM | POA: Diagnosis not present

## 2022-03-24 DIAGNOSIS — L4050 Arthropathic psoriasis, unspecified: Secondary | ICD-10-CM | POA: Diagnosis not present

## 2022-03-24 DIAGNOSIS — H15002 Unspecified scleritis, left eye: Secondary | ICD-10-CM | POA: Diagnosis not present

## 2022-03-24 MED ORDER — PREDNISONE 10 MG PO TABS
10.0000 mg | ORAL_TABLET | Freq: Every day | ORAL | 1 refills | Status: DC
  Filled 2022-03-24: qty 30, 30d supply, fill #0

## 2022-03-25 ENCOUNTER — Other Ambulatory Visit (HOSPITAL_BASED_OUTPATIENT_CLINIC_OR_DEPARTMENT_OTHER): Payer: Self-pay

## 2022-03-26 ENCOUNTER — Other Ambulatory Visit (HOSPITAL_BASED_OUTPATIENT_CLINIC_OR_DEPARTMENT_OTHER): Payer: Self-pay

## 2022-03-26 MED ORDER — OXYCODONE HCL 10 MG PO TABS
10.0000 mg | ORAL_TABLET | Freq: Every day | ORAL | 0 refills | Status: DC
  Filled 2022-03-26: qty 168, 28d supply, fill #0

## 2022-04-23 ENCOUNTER — Other Ambulatory Visit (HOSPITAL_BASED_OUTPATIENT_CLINIC_OR_DEPARTMENT_OTHER): Payer: Self-pay

## 2022-04-23 MED ORDER — OXYCODONE HCL 10 MG PO TABS
10.0000 mg | ORAL_TABLET | Freq: Every day | ORAL | 0 refills | Status: DC
  Filled 2022-04-23: qty 168, 28d supply, fill #0

## 2022-05-19 ENCOUNTER — Other Ambulatory Visit (HOSPITAL_BASED_OUTPATIENT_CLINIC_OR_DEPARTMENT_OTHER): Payer: Self-pay

## 2022-05-19 ENCOUNTER — Ambulatory Visit
Admission: RE | Admit: 2022-05-19 | Discharge: 2022-05-19 | Disposition: A | Discharge status: Home / Self Care | Payer: No Typology Code available for payment source | Source: Ambulatory Visit | Attending: Nurse Practitioner | Admitting: Nurse Practitioner

## 2022-05-19 VITALS — BP 125/83 | HR 107 | Temp 99.5°F | Resp 20

## 2022-05-19 DIAGNOSIS — R509 Fever, unspecified: Secondary | ICD-10-CM | POA: Insufficient documentation

## 2022-05-19 DIAGNOSIS — R059 Cough, unspecified: Secondary | ICD-10-CM | POA: Diagnosis present

## 2022-05-19 DIAGNOSIS — F17210 Nicotine dependence, cigarettes, uncomplicated: Secondary | ICD-10-CM | POA: Diagnosis not present

## 2022-05-19 DIAGNOSIS — J0191 Acute recurrent sinusitis, unspecified: Secondary | ICD-10-CM | POA: Insufficient documentation

## 2022-05-19 DIAGNOSIS — Z1152 Encounter for screening for COVID-19: Secondary | ICD-10-CM | POA: Insufficient documentation

## 2022-05-19 MED ORDER — AMOXICILLIN-POT CLAVULANATE 875-125 MG PO TABS
1.0000 | ORAL_TABLET | Freq: Two times a day (BID) | ORAL | 0 refills | Status: DC
  Filled 2022-05-19: qty 14, 7d supply, fill #0

## 2022-05-19 NOTE — ED Provider Notes (Signed)
[facility]    CSN: [id number] Arrival date & time: 05/19/22  1546      History   Chief Complaint Chief Complaint  Patient presents with   Cough    HPI Douglas Vazquez is a 42 y.o. male  presents for evaluation of URI symptoms for 4 days. Patient reports associated symptoms of sinus pressure/pain with congestion, cough, diarrhea. Denies N/V/D, fevers, ear pain, sore throat, shortness of breath, body aches. Patient does not have a hx of asthma.  Douglas Vazquez is an active smoker.  No known sick contacts.  Pt has taken allergy medicine OTC for symptoms. Pt has no other concerns at this time.    Cough   Past Medical History:  Diagnosis Date   Allergy    Anxiety    Ear congestion, bilateral 03/08/2017   Last Assessment & Plan:  Patient notes recurrent infections in the past. States Douglas Vazquez has a congestion/fluid-like feeling in the [place] today. States that his been going on for a couple weeks. Notes that it is concurrent with increase in fatigue. Denies any true pain. Denies any fever. No recent upper respiratory infection symptoms. Physical exam does not reveal any signs of infection or fluid behind    Essential hypertension 06/28/2014   Fatigue 03/08/2017   Last Assessment & Plan:  Patient notices new onset of fatigue 4 weeks ago. Douglas Vazquez states that there is no changes in his routine. Douglas Vazquez denies any changes in medication or diet. No evidence of active bleeding. Does note that Douglas Vazquez is noncompliant with his CPAP machine. Advised that we would do blood work today. Patient refused and states that Douglas Vazquez would like to repeat in 4 weeks as Douglas Vazquez has not had anything to    Herniated disc    Hypertension    Insomnia 03/08/2017   Last Assessment & Plan:  Patient notes some difficulty sleeping when Douglas Vazquez is trying to wear his CPAP machine. Douglas Vazquez states that also Douglas Vazquez has a little bit of restlessness at night. Is inquiring for a sleep aid to help him with these symptoms. Has tried melatonin in the past. Will prescribe Rozerem  8 mg tablets as a starting point to help with difficulty sleeping, in order to try and avoid increasing fati   Non-cardiac chest pain    Numbness and tingling in right hand 03/08/2017   Last Assessment & Plan:  Occurs mostly at night. Noticing symptoms are worsening. Notices some tingling and numbness in the right hand. Has not used any treatments thus far. Advised to use a wrist brace at night to help with likely carpal tunnel-like symptoms. Advised to follow up in 4 weeks.   Obese    Obesity (BMI 35.0-39.9 without comorbidity) 06/25/2015   OSA (obstructive sleep apnea) 09/30/2015   Overview:  08/2015  HSAT  AHI 21.4  Sat 80 % Average Sat 92%  Last Assessment & Plan:  Patient previously diagnosed. Noncompliant with CPAP at this time. Expereincing increased fatigue. Notes excessive daytime tiredness. States that Douglas Vazquez has difficulty sleeping with CPAP mask. Is recommended to wear the full mask as Douglas Vazquez sleeps with his mouth open. Has not been back to see a sleep doctor recently. [place]   Psoriasis 06/28/2014   Under care of dermatologist, Dr Redmond Pulling    Rhinitis, allergic 06/28/2014   Smoker    Tobacco use 04/09/2017   Last Assessment & Plan:  Smoking cessation desired. Previously failed chantix due to irritability. > 1PPD. 21 pack-year history. Will start bupropion 150mg  for 3 days  and increase to 300mg . Patient reports readiness for quitting. Offered talk therapy referral. [place] as well. Patient will try medications. Advised to pick a quit date, use other healthy habits to replace oral fixation during    Patient Active Problem List   Diagnosis Date Noted   Chronic low back pain 10/30/2021   Healthcare maintenance 10/30/2021   Tobacco use 04/09/2017   Ear congestion, bilateral 03/08/2017   Fatigue 03/08/2017   Insomnia 03/08/2017   Numbness and tingling in right hand 03/08/2017   OSA (obstructive sleep apnea) 09/30/2015   Obesity (BMI 35.0-39.9 without comorbidity) 06/25/2015   Rhinitis, allergic  06/28/2014   Psoriasis 06/28/2014   Essential hypertension 06/28/2014    Past Surgical History:  Procedure Laterality Date   TENDON REPAIR Right    R index finger tendon repair        Home Medications    Prior to Admission medications   Medication Sig Start Date End Date Taking? Authorizing Provider  amoxicillin-clavulanate (AUGMENTIN) 875-125 MG tablet Take 1 tablet by mouth every 12 (twelve) hours. 05/19/22  Yes Melynda Ripple, NP  amLODipine (NORVASC) 10 MG tablet Take 1 tablet (10 mg total) by mouth daily. 01/02/22   Libby Maw, MD  amLODipine (NORVASC) 10 MG tablet Take 1 tablet (10 mg total) by mouth daily. 08/22/21     benazepril (LOTENSIN) 10 MG tablet Take 1 tablet (10 mg total) by mouth daily. 01/02/22   Libby Maw, MD  benazepril (LOTENSIN) 40 MG tablet Take 1 tablet (40 mg total) by mouth daily. 06/23/21     benzonatate (TESSALON) 100 MG capsule Take 1 capsule (100 mg total) by mouth 3 (three) times daily as needed. 01/28/22   Mar Daring, PA-C  erythromycin ophthalmic ointment Place 1 Application into the right eye 2 (two) times daily. 12/15/21   Perlie Mayo, NP  hydrochlorothiazide (MICROZIDE) 12.5 MG capsule Take 1 capsule (12.5 mg total) by mouth daily. 01/02/22   Libby Maw, MD  hydrochlorothiazide (MICROZIDE) 12.5 MG capsule Take 1 capsule (12.5 mg total) by mouth daily. 06/23/21     meloxicam (MOBIC) 15 MG tablet Take 1 tablet (15 mg total) by mouth 1 (one) time each day for 14 days. 10/17/21     nitroGLYCERIN (NITROSTAT) 0.4 MG SL tablet Place 1 tablet (0.4 mg total) under the tongue every 5 (five) minutes as needed for chest pain. 05/13/17 10/30/21  Richardo Priest, MD  Oxycodone HCl 10 MG TABS Take 1 tablet (10 mg total) by mouth 6 (six) times daily as needed. 04/23/22     predniSONE (DELTASONE) 10 MG tablet Take 1 tablet (10 mg total) by mouth daily. 03/24/22     predniSONE (DELTASONE) 20 MG tablet Take 1 tablet (20 mg total)  by mouth 2 (two) times daily as needed for flares. 11/20/21     trimethoprim-polymyxin b (POLYTRIM) ophthalmic solution Place 1 drop into the right eye every 4 (four) hours. 12/17/21     eplerenone (INSPRA) 50 MG tablet Take 1 tablet (50 mg total) by mouth daily. 05/13/17 07/25/19  Richardo Priest, MD  metoprolol tartrate (LOPRESSOR) 50 MG tablet Take 1 tablet (50 mg total) by mouth once for 1 dose. Take 1 hour before cardiac CTA. 05/13/17 07/25/19  Richardo Priest, MD    Family History Family History  Problem Relation Age of Onset   Heart attack Father    Stroke Father    Hypertension Father    Diabetes Father  Heart attack Paternal Uncle    Anxiety disorder Mother    Cancer Mother        breast   Dementia Paternal Grandmother     Social History Social History   Tobacco Use   Smoking status: Every Day    Packs/day: 1    Types: Cigarettes   Smokeless tobacco: Never  Vaping Use   Vaping Use: Former  Substance Use Topics   Alcohol use: No   Drug use: No     Allergies   Doxycycline   Review of Systems Review of Systems  HENT:  Positive for congestion, sinus pressure and sinus pain.   Respiratory:  Positive for cough.      Physical Exam Triage Vital Signs ED Triage Vitals  Enc Vitals Group     BP 05/19/22 1605 125/83     Pulse Rate 05/19/22 1604 (!) 107     Resp 05/19/22 1604 20     Temp 05/19/22 1604 99.5 F (37.5 C)     Temp Source 05/19/22 1604 Oral     SpO2 05/19/22 1604 97 %     Weight --      Height --      Head Circumference --      Peak Flow --      Pain Score 05/19/22 1602 5     Pain Loc --      Pain Edu? --      Excl. in [place]? --    No Vazquez found.  Updated Vital Signs BP 125/83   Pulse (!) 107   Temp 99.5 F (37.5 C) (Oral)   Resp 20   SpO2 97%   Visual Acuity Right Eye Distance:   Left Eye Distance:   Bilateral Distance:    Right Eye Near:   Left Eye Near:    Bilateral Near:     Physical Exam Vitals and nursing note reviewed.   Constitutional:      General: Douglas Vazquez is not in acute distress.    Appearance: Normal appearance. Douglas Vazquez is not ill-appearing or toxic-appearing.  HENT:     Head: Normocephalic and atraumatic.     Right Ear: Tympanic membrane and ear canal normal.     Left Ear: Tympanic membrane and ear canal normal.     Nose: Congestion present.     Mouth/Throat:     Mouth: Mucous membranes are moist.     Pharynx: No oropharyngeal exudate or posterior oropharyngeal erythema.  Eyes:     Pupils: Pupils are equal, round, and reactive to light.  Cardiovascular:     Rate and Rhythm: Normal rate and regular rhythm.     Heart sounds: Normal heart sounds.  Pulmonary:     Effort: Pulmonary effort is normal.     Breath sounds: Normal breath sounds.  Musculoskeletal:     Cervical back: Normal range of motion and neck supple.  Lymphadenopathy:     Cervical: No cervical adenopathy.  Skin:    General: Skin is warm and dry.  Neurological:     General: No focal deficit present.     Mental Status: Douglas Vazquez is alert and oriented to person, place, and time.  Psychiatric:        Mood and Affect: Mood normal.        Behavior: Behavior normal.      UC Treatments / Results  Labs (all labs ordered are listed, but only abnormal results are displayed) Labs Reviewed  SARS CORONAVIRUS 2 (TAT 6-24 HRS)  EKG   Radiology No results found.  Procedures Procedures (including critical care time)  Medications Ordered in UC Medications - No Vazquez to display  Initial Impression / Assessment and Plan / UC Course  I have reviewed the triage vital signs and the nursing notes.  Pertinent labs & imaging results that were available during my care of the patient were reviewed by me and considered in my medical decision making (see chart for details).     Reviewed exam and symptoms with patient.  COVID PCR and will contact if positive Given fever and sinus pressure will cover for sinusitis with Augmentin Douglas Vazquez has Flonase at home  and will use daily Continue allergy medicine as needed Nasal rinses as tolerated Follow-up with PCP if symptoms do not improve ER precautions reviewed and patient verbalized understanding Final Clinical Impressions(s) / UC Diagnoses   Final diagnoses:  Fever, unspecified  Acute recurrent sinusitis, unspecified location     Discharge Instructions      Start Augmentin twice daily for 7 days Continue allergy medicine and start Flonase daily Nasal rinses as tolerated Clinical contact you for the results of your COVID test if positive Follow-up with your PCP if symptoms do not improve Please go to the ER for any worsening symptoms    ED Prescriptions     Medication Sig Dispense Auth. Provider   amoxicillin-clavulanate (AUGMENTIN) 875-125 MG tablet Take 1 tablet by mouth every 12 (twelve) hours. 14 tablet Melynda Ripple, NP      PDMP not reviewed this encounter.   Melynda Ripple, NP 05/19/22 1620

## 2022-05-19 NOTE — Discharge Instructions (Signed)
Start Augmentin twice daily for 7 days Continue allergy medicine and start Flonase daily Nasal rinses as tolerated Clinical contact you for the results of your COVID test if positive Follow-up with your PCP if symptoms do not improve Please go to the ER for any worsening symptoms

## 2022-05-19 NOTE — ED Triage Notes (Signed)
Pt presents with c/o a cough x 4 days.   States he has trouble catching a full deep breathe. He feels nauseas and tired.   Started diarrhea yesterday, denies fever.

## 2022-05-20 LAB — SARS CORONAVIRUS 2 (TAT 6-24 HRS): SARS Coronavirus 2: NEGATIVE

## 2022-05-21 ENCOUNTER — Other Ambulatory Visit (HOSPITAL_BASED_OUTPATIENT_CLINIC_OR_DEPARTMENT_OTHER): Payer: Self-pay

## 2022-05-21 ENCOUNTER — Other Ambulatory Visit: Payer: Self-pay

## 2022-05-21 MED ORDER — OXYCODONE HCL 10 MG PO TABS
10.0000 mg | ORAL_TABLET | Freq: Every day | ORAL | 0 refills | Status: DC
  Filled 2022-05-21: qty 168, 28d supply, fill #0

## 2022-06-05 ENCOUNTER — Other Ambulatory Visit: Payer: Self-pay | Admitting: Cardiology

## 2022-06-05 NOTE — Telephone Encounter (Signed)
Needs appointment, last seen 2019.

## 2022-06-11 ENCOUNTER — Encounter: Payer: Self-pay | Admitting: Urology

## 2022-06-11 ENCOUNTER — Encounter: Payer: No Typology Code available for payment source | Admitting: Urology

## 2022-06-11 NOTE — Progress Notes (Deleted)
Assessment: No diagnosis found.   Plan: ***  Chief Complaint: No chief complaint on file.   History of Present Illness:  Douglas Vazquez is a 42 y.o. male who is seen in consultation from Mliss Sax, MD for evaluation of ***.   Past Medical History:  Past Medical History:  Diagnosis Date   Allergy    Anxiety    Ear congestion, bilateral 03/08/2017   Last Assessment & Plan:  Patient notes recurrent infections in the past. States he has a congestion/fluid-like feeling in the [place] today. States that his been going on for a couple weeks. Notes that it is concurrent with increase in fatigue. Denies any true pain. Denies any fever. No recent upper respiratory infection symptoms. Physical exam does not reveal any signs of infection or fluid behind    Essential hypertension 06/28/2014   Fatigue 03/08/2017   Last Assessment & Plan:  Patient notices new onset of fatigue 4 weeks ago. He states that there is no changes in his routine. He denies any changes in medication or diet. No evidence of active bleeding. Does note that he is noncompliant with his CPAP machine. Advised that we would do blood work today. Patient refused and states that he would like to repeat in 4 weeks as he has not had anything to    Herniated disc    Hypertension    Insomnia 03/08/2017   Last Assessment & Plan:  Patient notes some difficulty sleeping when he is trying to wear his CPAP machine. He states that also he has a little bit of restlessness at night. Is inquiring for a sleep aid to help him with these symptoms. Has tried melatonin in the past. Will prescribe Rozerem 8 mg tablets as a starting point to help with difficulty sleeping, in order to try and avoid increasing fati   Non-cardiac chest pain    Numbness and tingling in right hand 03/08/2017   Last Assessment & Plan:  Occurs mostly at night. Noticing symptoms are worsening. Notices some tingling and numbness in the right hand. Has not used any  treatments thus far. Advised to use a wrist brace at night to help with likely carpal tunnel-like symptoms. Advised to follow up in 4 weeks.   Obese    Obesity (BMI 35.0-39.9 without comorbidity) 06/25/2015   OSA (obstructive sleep apnea) 09/30/2015   Overview:  08/2015  HSAT  AHI 21.4  Sat 80 % Average Sat 92%  Last Assessment & Plan:  Patient previously diagnosed. Noncompliant with CPAP at this time. Expereincing increased fatigue. Notes excessive daytime tiredness. States that he has difficulty sleeping with CPAP mask. Is recommended to wear the full mask as he sleeps with his mouth open. Has not been back to see a sleep doctor recently. [place]   Psoriasis 06/28/2014   Under care of dermatologist, Dr Andrey Campanile    Rhinitis, allergic 06/28/2014   Smoker    Tobacco use 04/09/2017   Last Assessment & Plan:  Smoking cessation desired. Previously failed chantix due to irritability. > 1PPD. 21 pack-year history. Will start bupropion 150mg  for 3 days and increase to 300mg . Patient reports readiness for quitting. Offered talk therapy referral. 1800 QUIT LINE as well. Patient will try medications. Advised to pick a quit date, use other healthy habits to replace oral fixation during    Past Surgical History:  Past Surgical History:  Procedure Laterality Date   TENDON REPAIR Right    R index finger tendon repair     Allergies:  Allergies  Allergen Reactions   Doxycycline Rash    Family History:  Family History  Problem Relation Age of Onset   Heart attack Father    Stroke Father    Hypertension Father    Diabetes Father    Heart attack Paternal Uncle    Anxiety disorder Mother    Cancer Mother        breast   Dementia Paternal Grandmother     Social History:  Social History   Tobacco Use   Smoking status: Every Day    Packs/day: 1    Types: Cigarettes   Smokeless tobacco: Never  Vaping Use   Vaping Use: Former  Substance Use Topics   Alcohol use: No   Drug use: No    Review of  symptoms:  Constitutional:  Negative for unexplained weight loss, night sweats, fever, chills ENT:  Negative for nose bleeds, sinus pain, painful swallowing CV:  Negative for chest pain, shortness of breath, exercise intolerance, palpitations, loss of consciousness Resp:  Negative for cough, wheezing, shortness of breath GI:  Negative for nausea, vomiting, diarrhea, bloody stools GU:  Positives noted in HPI; otherwise negative for gross hematuria, dysuria, urinary incontinence Neuro:  Negative for seizures, poor balance, limb weakness, slurred speech Psych:  Negative for lack of energy, depression, anxiety Endocrine:  Negative for polydipsia, polyuria, symptoms of hypoglycemia (dizziness, hunger, sweating) Hematologic:  Negative for anemia, purpura, petechia, prolonged or excessive bleeding, use of anticoagulants  Allergic:  Negative for difficulty breathing or choking as a result of exposure to anything; no shellfish allergy; no allergic response (rash/itch) to materials, foods  Physical exam: There were no vitals taken for this visit. GENERAL APPEARANCE:  Well appearing, well developed, well nourished, NAD HEENT: Atraumatic, Normocephalic, oropharynx clear. NECK: Supple without lymphadenopathy or thyromegaly. LUNGS: Clear to auscultation bilaterally. HEART: Regular Rate and Rhythm without murmurs, gallops, or rubs. ABDOMEN: Soft, non-tender, No Masses. EXTREMITIES: Moves all extremities well.  Without clubbing, cyanosis, or edema. NEUROLOGIC:  Alert and oriented x 3, normal gait, CN II-XII grossly intact.  MENTAL STATUS:  Appropriate. BACK:  Non-tender to palpation.  No CVAT SKIN:  Warm, dry and intact.    Results: No results found for this or any previous visit (from the past 24 hour(s)).

## 2022-06-18 ENCOUNTER — Other Ambulatory Visit (HOSPITAL_BASED_OUTPATIENT_CLINIC_OR_DEPARTMENT_OTHER): Payer: Self-pay

## 2022-06-18 ENCOUNTER — Other Ambulatory Visit: Payer: Self-pay

## 2022-06-18 MED ORDER — OXYCODONE HCL 10 MG PO TABS
10.0000 mg | ORAL_TABLET | Freq: Every day | ORAL | 0 refills | Status: DC
  Filled 2022-06-18 (×2): qty 168, 28d supply, fill #0

## 2022-06-25 DIAGNOSIS — F411 Generalized anxiety disorder: Secondary | ICD-10-CM | POA: Diagnosis not present

## 2022-06-25 DIAGNOSIS — F1120 Opioid dependence, uncomplicated: Secondary | ICD-10-CM | POA: Diagnosis not present

## 2022-06-25 DIAGNOSIS — F332 Major depressive disorder, recurrent severe without psychotic features: Secondary | ICD-10-CM | POA: Diagnosis not present

## 2022-07-01 ENCOUNTER — Other Ambulatory Visit: Payer: Self-pay

## 2022-07-08 DIAGNOSIS — I10 Essential (primary) hypertension: Secondary | ICD-10-CM | POA: Diagnosis not present

## 2022-07-08 DIAGNOSIS — F411 Generalized anxiety disorder: Secondary | ICD-10-CM | POA: Diagnosis not present

## 2022-07-08 DIAGNOSIS — L4052 Psoriatic arthritis mutilans: Secondary | ICD-10-CM | POA: Diagnosis not present

## 2022-07-08 DIAGNOSIS — F5221 Male erectile disorder: Secondary | ICD-10-CM | POA: Diagnosis not present

## 2022-07-08 DIAGNOSIS — F1120 Opioid dependence, uncomplicated: Secondary | ICD-10-CM | POA: Diagnosis not present

## 2022-07-08 DIAGNOSIS — F1211 Cannabis abuse, in remission: Secondary | ICD-10-CM | POA: Diagnosis not present

## 2022-07-08 DIAGNOSIS — F605 Obsessive-compulsive personality disorder: Secondary | ICD-10-CM | POA: Diagnosis not present

## 2022-07-08 DIAGNOSIS — F1091 Alcohol use, unspecified, in remission: Secondary | ICD-10-CM | POA: Diagnosis not present

## 2022-07-09 DIAGNOSIS — F1120 Opioid dependence, uncomplicated: Secondary | ICD-10-CM | POA: Diagnosis not present

## 2022-07-09 DIAGNOSIS — F1110 Opioid abuse, uncomplicated: Secondary | ICD-10-CM | POA: Diagnosis not present

## 2022-07-15 DIAGNOSIS — F5221 Male erectile disorder: Secondary | ICD-10-CM | POA: Diagnosis not present

## 2022-07-15 DIAGNOSIS — F1110 Opioid abuse, uncomplicated: Secondary | ICD-10-CM | POA: Diagnosis not present

## 2022-07-15 DIAGNOSIS — L4052 Psoriatic arthritis mutilans: Secondary | ICD-10-CM | POA: Diagnosis not present

## 2022-07-15 DIAGNOSIS — F1120 Opioid dependence, uncomplicated: Secondary | ICD-10-CM | POA: Diagnosis not present

## 2022-07-15 DIAGNOSIS — F1211 Cannabis abuse, in remission: Secondary | ICD-10-CM | POA: Diagnosis not present

## 2022-07-15 DIAGNOSIS — F605 Obsessive-compulsive personality disorder: Secondary | ICD-10-CM | POA: Diagnosis not present

## 2022-07-15 DIAGNOSIS — F411 Generalized anxiety disorder: Secondary | ICD-10-CM | POA: Diagnosis not present

## 2022-07-15 DIAGNOSIS — F1091 Alcohol use, unspecified, in remission: Secondary | ICD-10-CM | POA: Diagnosis not present

## 2022-07-15 DIAGNOSIS — I10 Essential (primary) hypertension: Secondary | ICD-10-CM | POA: Diagnosis not present

## 2022-07-16 DIAGNOSIS — F411 Generalized anxiety disorder: Secondary | ICD-10-CM | POA: Diagnosis not present

## 2022-07-20 ENCOUNTER — Other Ambulatory Visit (HOSPITAL_BASED_OUTPATIENT_CLINIC_OR_DEPARTMENT_OTHER): Payer: Self-pay

## 2022-07-20 ENCOUNTER — Other Ambulatory Visit: Payer: Self-pay | Admitting: Cardiology

## 2022-07-20 ENCOUNTER — Other Ambulatory Visit: Payer: Self-pay | Admitting: Family Medicine

## 2022-07-20 DIAGNOSIS — L4050 Arthropathic psoriasis, unspecified: Secondary | ICD-10-CM | POA: Diagnosis not present

## 2022-07-20 DIAGNOSIS — M25569 Pain in unspecified knee: Secondary | ICD-10-CM | POA: Diagnosis not present

## 2022-07-20 DIAGNOSIS — Z79899 Other long term (current) drug therapy: Secondary | ICD-10-CM | POA: Diagnosis not present

## 2022-07-20 DIAGNOSIS — L409 Psoriasis, unspecified: Secondary | ICD-10-CM | POA: Diagnosis not present

## 2022-07-20 DIAGNOSIS — H15002 Unspecified scleritis, left eye: Secondary | ICD-10-CM | POA: Diagnosis not present

## 2022-07-20 DIAGNOSIS — M549 Dorsalgia, unspecified: Secondary | ICD-10-CM | POA: Diagnosis not present

## 2022-07-20 DIAGNOSIS — M25579 Pain in unspecified ankle and joints of unspecified foot: Secondary | ICD-10-CM | POA: Diagnosis not present

## 2022-07-20 MED ORDER — MELOXICAM 15 MG PO TABS
15.0000 mg | ORAL_TABLET | Freq: Every day | ORAL | 1 refills | Status: DC
  Filled 2022-07-20: qty 30, 30d supply, fill #0

## 2022-07-20 MED ORDER — AMLODIPINE BESYLATE 10 MG PO TABS
10.0000 mg | ORAL_TABLET | Freq: Every day | ORAL | 1 refills | Status: AC
  Filled 2022-07-20: qty 30, 30d supply, fill #0
  Filled 2022-11-05: qty 30, 30d supply, fill #1

## 2022-07-20 MED ORDER — HYDROCHLOROTHIAZIDE 12.5 MG PO CAPS
12.5000 mg | ORAL_CAPSULE | Freq: Every day | ORAL | 1 refills | Status: AC
  Filled 2022-07-20: qty 30, 30d supply, fill #0

## 2022-07-21 NOTE — Telephone Encounter (Signed)
Denied rx, patient needs appointmen

## 2022-07-22 DIAGNOSIS — F17200 Nicotine dependence, unspecified, uncomplicated: Secondary | ICD-10-CM | POA: Diagnosis not present

## 2022-07-22 DIAGNOSIS — G4762 Sleep related leg cramps: Secondary | ICD-10-CM | POA: Diagnosis not present

## 2022-07-22 DIAGNOSIS — F411 Generalized anxiety disorder: Secondary | ICD-10-CM | POA: Diagnosis not present

## 2022-07-22 DIAGNOSIS — F1120 Opioid dependence, uncomplicated: Secondary | ICD-10-CM | POA: Diagnosis not present

## 2022-07-22 DIAGNOSIS — I10 Essential (primary) hypertension: Secondary | ICD-10-CM | POA: Diagnosis not present

## 2022-07-22 DIAGNOSIS — F5221 Male erectile disorder: Secondary | ICD-10-CM | POA: Diagnosis not present

## 2022-07-22 DIAGNOSIS — L4052 Psoriatic arthritis mutilans: Secondary | ICD-10-CM | POA: Diagnosis not present

## 2022-07-22 DIAGNOSIS — F1211 Cannabis abuse, in remission: Secondary | ICD-10-CM | POA: Diagnosis not present

## 2022-07-22 DIAGNOSIS — F1110 Opioid abuse, uncomplicated: Secondary | ICD-10-CM | POA: Diagnosis not present

## 2022-07-22 DIAGNOSIS — F605 Obsessive-compulsive personality disorder: Secondary | ICD-10-CM | POA: Diagnosis not present

## 2022-07-22 DIAGNOSIS — F1091 Alcohol use, unspecified, in remission: Secondary | ICD-10-CM | POA: Diagnosis not present

## 2022-07-29 DIAGNOSIS — F1091 Alcohol use, unspecified, in remission: Secondary | ICD-10-CM | POA: Diagnosis not present

## 2022-07-29 DIAGNOSIS — F1211 Cannabis abuse, in remission: Secondary | ICD-10-CM | POA: Diagnosis not present

## 2022-07-29 DIAGNOSIS — F1120 Opioid dependence, uncomplicated: Secondary | ICD-10-CM | POA: Diagnosis not present

## 2022-07-29 DIAGNOSIS — F411 Generalized anxiety disorder: Secondary | ICD-10-CM | POA: Diagnosis not present

## 2022-07-29 DIAGNOSIS — I10 Essential (primary) hypertension: Secondary | ICD-10-CM | POA: Diagnosis not present

## 2022-07-30 DIAGNOSIS — F1110 Opioid abuse, uncomplicated: Secondary | ICD-10-CM | POA: Diagnosis not present

## 2022-07-30 DIAGNOSIS — F1120 Opioid dependence, uncomplicated: Secondary | ICD-10-CM | POA: Diagnosis not present

## 2022-08-12 DIAGNOSIS — G4762 Sleep related leg cramps: Secondary | ICD-10-CM | POA: Diagnosis not present

## 2022-08-12 DIAGNOSIS — L4052 Psoriatic arthritis mutilans: Secondary | ICD-10-CM | POA: Diagnosis not present

## 2022-08-12 DIAGNOSIS — F605 Obsessive-compulsive personality disorder: Secondary | ICD-10-CM | POA: Diagnosis not present

## 2022-08-12 DIAGNOSIS — F1211 Cannabis abuse, in remission: Secondary | ICD-10-CM | POA: Diagnosis not present

## 2022-08-12 DIAGNOSIS — F5221 Male erectile disorder: Secondary | ICD-10-CM | POA: Diagnosis not present

## 2022-08-12 DIAGNOSIS — F411 Generalized anxiety disorder: Secondary | ICD-10-CM | POA: Diagnosis not present

## 2022-08-12 DIAGNOSIS — I10 Essential (primary) hypertension: Secondary | ICD-10-CM | POA: Diagnosis not present

## 2022-08-12 DIAGNOSIS — F1091 Alcohol use, unspecified, in remission: Secondary | ICD-10-CM | POA: Diagnosis not present

## 2022-08-12 DIAGNOSIS — F1120 Opioid dependence, uncomplicated: Secondary | ICD-10-CM | POA: Diagnosis not present

## 2022-08-12 DIAGNOSIS — F349 Persistent mood [affective] disorder, unspecified: Secondary | ICD-10-CM | POA: Diagnosis not present

## 2022-08-12 DIAGNOSIS — F17200 Nicotine dependence, unspecified, uncomplicated: Secondary | ICD-10-CM | POA: Diagnosis not present

## 2022-08-13 DIAGNOSIS — F1110 Opioid abuse, uncomplicated: Secondary | ICD-10-CM | POA: Diagnosis not present

## 2022-08-13 DIAGNOSIS — F1120 Opioid dependence, uncomplicated: Secondary | ICD-10-CM | POA: Diagnosis not present

## 2022-08-26 DIAGNOSIS — F411 Generalized anxiety disorder: Secondary | ICD-10-CM | POA: Diagnosis not present

## 2022-08-26 DIAGNOSIS — F1211 Cannabis abuse, in remission: Secondary | ICD-10-CM | POA: Diagnosis not present

## 2022-08-26 DIAGNOSIS — F1120 Opioid dependence, uncomplicated: Secondary | ICD-10-CM | POA: Diagnosis not present

## 2022-08-26 DIAGNOSIS — L4052 Psoriatic arthritis mutilans: Secondary | ICD-10-CM | POA: Diagnosis not present

## 2022-08-26 DIAGNOSIS — G4762 Sleep related leg cramps: Secondary | ICD-10-CM | POA: Diagnosis not present

## 2022-08-26 DIAGNOSIS — F349 Persistent mood [affective] disorder, unspecified: Secondary | ICD-10-CM | POA: Diagnosis not present

## 2022-08-26 DIAGNOSIS — I10 Essential (primary) hypertension: Secondary | ICD-10-CM | POA: Diagnosis not present

## 2022-08-26 DIAGNOSIS — F605 Obsessive-compulsive personality disorder: Secondary | ICD-10-CM | POA: Diagnosis not present

## 2022-08-26 DIAGNOSIS — F1110 Opioid abuse, uncomplicated: Secondary | ICD-10-CM | POA: Diagnosis not present

## 2022-08-26 DIAGNOSIS — F1091 Alcohol use, unspecified, in remission: Secondary | ICD-10-CM | POA: Diagnosis not present

## 2022-08-26 DIAGNOSIS — F17200 Nicotine dependence, unspecified, uncomplicated: Secondary | ICD-10-CM | POA: Diagnosis not present

## 2022-08-26 DIAGNOSIS — F5221 Male erectile disorder: Secondary | ICD-10-CM | POA: Diagnosis not present

## 2022-09-23 DIAGNOSIS — F1211 Cannabis abuse, in remission: Secondary | ICD-10-CM | POA: Diagnosis not present

## 2022-09-23 DIAGNOSIS — F17200 Nicotine dependence, unspecified, uncomplicated: Secondary | ICD-10-CM | POA: Diagnosis not present

## 2022-09-23 DIAGNOSIS — F605 Obsessive-compulsive personality disorder: Secondary | ICD-10-CM | POA: Diagnosis not present

## 2022-09-23 DIAGNOSIS — G4762 Sleep related leg cramps: Secondary | ICD-10-CM | POA: Diagnosis not present

## 2022-09-23 DIAGNOSIS — L4052 Psoriatic arthritis mutilans: Secondary | ICD-10-CM | POA: Diagnosis not present

## 2022-09-23 DIAGNOSIS — F5221 Male erectile disorder: Secondary | ICD-10-CM | POA: Diagnosis not present

## 2022-09-23 DIAGNOSIS — F1120 Opioid dependence, uncomplicated: Secondary | ICD-10-CM | POA: Diagnosis not present

## 2022-09-23 DIAGNOSIS — I10 Essential (primary) hypertension: Secondary | ICD-10-CM | POA: Diagnosis not present

## 2022-09-23 DIAGNOSIS — F411 Generalized anxiety disorder: Secondary | ICD-10-CM | POA: Diagnosis not present

## 2022-09-23 DIAGNOSIS — F1091 Alcohol use, unspecified, in remission: Secondary | ICD-10-CM | POA: Diagnosis not present

## 2022-09-24 DIAGNOSIS — F1120 Opioid dependence, uncomplicated: Secondary | ICD-10-CM | POA: Diagnosis not present

## 2022-09-24 DIAGNOSIS — F1110 Opioid abuse, uncomplicated: Secondary | ICD-10-CM | POA: Diagnosis not present

## 2022-09-30 ENCOUNTER — Emergency Department (HOSPITAL_BASED_OUTPATIENT_CLINIC_OR_DEPARTMENT_OTHER): Payer: 59

## 2022-09-30 ENCOUNTER — Other Ambulatory Visit: Payer: Self-pay

## 2022-09-30 ENCOUNTER — Emergency Department (HOSPITAL_BASED_OUTPATIENT_CLINIC_OR_DEPARTMENT_OTHER)
Admission: EM | Admit: 2022-09-30 | Discharge: 2022-09-30 | Disposition: A | Discharge status: Home / Self Care | Payer: 59 | Attending: Emergency Medicine | Admitting: Emergency Medicine

## 2022-09-30 ENCOUNTER — Encounter (HOSPITAL_BASED_OUTPATIENT_CLINIC_OR_DEPARTMENT_OTHER): Payer: Self-pay

## 2022-09-30 DIAGNOSIS — R072 Precordial pain: Secondary | ICD-10-CM | POA: Insufficient documentation

## 2022-09-30 DIAGNOSIS — R0789 Other chest pain: Secondary | ICD-10-CM | POA: Diagnosis present

## 2022-09-30 DIAGNOSIS — Z87891 Personal history of nicotine dependence: Secondary | ICD-10-CM | POA: Insufficient documentation

## 2022-09-30 DIAGNOSIS — M25512 Pain in left shoulder: Secondary | ICD-10-CM | POA: Insufficient documentation

## 2022-09-30 DIAGNOSIS — I10 Essential (primary) hypertension: Secondary | ICD-10-CM | POA: Insufficient documentation

## 2022-09-30 DIAGNOSIS — Z79899 Other long term (current) drug therapy: Secondary | ICD-10-CM | POA: Insufficient documentation

## 2022-09-30 DIAGNOSIS — R079 Chest pain, unspecified: Secondary | ICD-10-CM | POA: Diagnosis not present

## 2022-09-30 LAB — BASIC METABOLIC PANEL
Anion gap: 8 (ref 5–15)
BUN: 10 mg/dL (ref 6–20)
CO2: 26 mmol/L (ref 22–32)
Calcium: 9 mg/dL (ref 8.9–10.3)
Chloride: 105 mmol/L (ref 98–111)
Creatinine, Ser: 1.07 mg/dL (ref 0.61–1.24)
GFR, Estimated: >60 mL/min (ref >60)
Glucose, Bld: 87 mg/dL (ref 70–99)
Potassium: 3.7 mmol/L (ref 3.5–5.1)
Sodium: 139 mmol/L (ref 135–145)

## 2022-09-30 LAB — CBC
HCT: 43.3 % (ref 39.0–52.0)
Hemoglobin: 15 g/dL (ref 13.0–17.0)
MCH: 29.6 pg (ref 26.0–34.0)
MCHC: 34.6 g/dL (ref 30.0–36.0)
MCV: 85.4 fL (ref 80.0–100.0)
Platelets: 266 10*3/uL (ref 150–400)
RBC: 5.07 MIL/uL (ref 4.22–5.81)
RDW: 13.4 % (ref 11.5–15.5)
WBC: 12.7 10*3/uL — ABNORMAL HIGH (ref 4.0–10.5)
nRBC: 0 % (ref 0.0–0.2)

## 2022-09-30 LAB — TROPONIN I (HIGH SENSITIVITY)
Troponin I (High Sensitivity): 5 ng/L (ref <18)
Troponin I (High Sensitivity): 5 ng/L (ref <18)

## 2022-09-30 NOTE — ED Provider Notes (Signed)
[facility] Provider Note   CSN: [id number] Arrival date & time: 09/30/22  2032     History  Chief Complaint  Patient presents with   Chest Pain    Douglas Vazquez is a 42 y.o. male.  Patient with history of hypertension, sleep apnea, smoking history --presents to the emergency department today for evaluation of left-sided chest pain and shoulder pain.  Symptoms started around 5 PM at rest.  He was talking to a coworker.  He reports that he had a particularly stressful day.  He developed a pressure in the left chest with radiation to the left shoulder area.  This lasted for couple of hours.  Around 7 PM he had some numbness in the shoulder which was concerning to him.  He decided to come to the emergency department.  In the interim, his symptoms have gradually resolved.  He denies associated lightheadedness or syncope.  No fever or cough.  No vomiting or diaphoresis.  He has had similar symptoms in the past.  Cardiac workup in 2019 with CT with calcium score of 0.  Patient denies risk factors for pulmonary embolism including: unilateral leg swelling, history of DVT/PE/other blood clots, use of exogenous hormones, recent immobilizations, recent surgery, recent travel (>4hr segment), malignancy, hemoptysis.         Home Medications Prior to Admission medications   Medication Sig Start Date End Date Taking? Authorizing Provider  amLODipine (NORVASC) 10 MG tablet Take 1 tablet (10 mg total) by mouth daily. 07/20/22   Mliss Sax, MD  amoxicillin-clavulanate (AUGMENTIN) 875-125 MG tablet Take 1 tablet by mouth every 12 (twelve) hours. 05/19/22   Radford Pax, NP  benazepril (LOTENSIN) 10 MG tablet Take 1 tablet (10 mg total) by mouth daily. 01/02/22   Mliss Sax, MD  benazepril (LOTENSIN) 40 MG tablet Take 1 tablet (40 mg total) by mouth daily. 06/23/21     benzonatate (TESSALON) 100 MG capsule Take 1 capsule (100 mg total)  by mouth 3 (three) times daily as needed. 01/28/22   Margaretann Loveless, PA-C  erythromycin ophthalmic ointment Place 1 Application into the right eye 2 (two) times daily. 12/15/21   Freddy Finner, NP  hydrochlorothiazide (MICROZIDE) 12.5 MG capsule Take 1 capsule (12.5 mg total) by mouth daily. 07/20/22   Mliss Sax, MD  meloxicam (MOBIC) 15 MG tablet Take 1 tablet (15 mg total) by mouth 1 (one) time each day for 14 days. 10/17/21     meloxicam (MOBIC) 15 MG tablet Take 1 tablet (15 mg total) by mouth daily for 30 days. 07/20/22     nitroGLYCERIN (NITROSTAT) 0.4 MG SL tablet Place 1 tablet (0.4 mg total) under the tongue every 5 (five) minutes as needed for chest pain. 05/13/17 10/30/21  Baldo Daub, MD  Oxycodone HCl 10 MG TABS Take 1 tablet (10 mg total) by mouth 6 (six) times daily as needed. 06/18/22     predniSONE (DELTASONE) 10 MG tablet Take 1 tablet (10 mg total) by mouth daily. 03/24/22     predniSONE (DELTASONE) 20 MG tablet Take 1 tablet (20 mg total) by mouth 2 (two) times daily as needed for flares. 11/20/21     trimethoprim-polymyxin b (POLYTRIM) ophthalmic solution Place 1 drop into the right eye every 4 (four) hours. 12/17/21     eplerenone (INSPRA) 50 MG tablet Take 1 tablet (50 mg total) by mouth daily. 05/13/17 07/25/19  Baldo Daub, MD  metoprolol tartrate (  LOPRESSOR) 50 MG tablet Take 1 tablet (50 mg total) by mouth once for 1 dose. Take 1 hour before cardiac CTA. 05/13/17 07/25/19  Baldo Daub, MD      Allergies    Doxycycline    Review of Systems   Review of Systems  Physical Exam Updated Vital Signs BP (!) 149/104   Pulse 93   Temp 97.7 F (36.5 C)   Resp 18   Ht 5\' 8"  (1.727 m)   Wt 113.4 kg   SpO2 98%   BMI 38.01 kg/m   Physical Exam Vitals and nursing note reviewed.  Constitutional:      Appearance: He is well-developed. He is not diaphoretic.  HENT:     Head: Normocephalic and atraumatic.     Mouth/Throat:     Mouth: Mucous  membranes are not dry.  Eyes:     Conjunctiva/sclera: Conjunctivae normal.  Neck:     Vascular: Normal carotid pulses. No carotid bruit or JVD.     Trachea: Trachea normal. No tracheal deviation.  Cardiovascular:     Rate and Rhythm: Normal rate and regular rhythm.     Pulses: No decreased pulses.          Radial pulses are 2+ on the right side and 2+ on the left side.     Heart sounds: Normal heart sounds, S1 normal and S2 normal. Heart sounds not distant. No murmur heard. Pulmonary:     Effort: Pulmonary effort is normal. No respiratory distress.     Breath sounds: Normal breath sounds. No wheezing.  Chest:     Chest wall: No tenderness.  Abdominal:     General: Bowel sounds are normal.     Palpations: Abdomen is soft.     Tenderness: There is no abdominal tenderness. There is no guarding or rebound.  Musculoskeletal:     Cervical back: Normal range of motion and neck supple. No muscular tenderness.     Right lower leg: No edema.     Left lower leg: No edema.  Skin:    General: Skin is warm and dry.     Coloration: Skin is not pale.  Neurological:     Mental Status: He is alert. Mental status is at baseline.  Psychiatric:        Mood and Affect: Mood normal.     ED Results / Procedures / Treatments   Labs (all labs ordered are listed, but only abnormal results are displayed) Labs Reviewed  CBC - Abnormal; Notable for the following components:      Result Value   WBC 12.7 (*)    All other components within normal limits  BASIC METABOLIC PANEL  TROPONIN I (HIGH SENSITIVITY)  TROPONIN I (HIGH SENSITIVITY)    ED ECG REPORT   Date: 09/30/2022  Rate: 90  Rhythm: normal sinus rhythm  QRS Axis: normal  Intervals: normal  ST/T Wave abnormalities: normal  Conduction Disutrbances:none  Narrative Interpretation:   Old EKG Reviewed: unchanged  I have personally reviewed the EKG tracing and agree with the computerized printout as noted.   Radiology DG Chest 2  View  Result Date: 09/30/2022 CLINICAL DATA:  Left-sided chest pain. EXAM: CHEST - 2 VIEW COMPARISON:  Jul 26, 2019 FINDINGS: The heart size and mediastinal contours are within normal limits. Both lungs are clear. The visualized skeletal structures are unremarkable. IMPRESSION: No active cardiopulmonary disease. Electronically Signed   By: Aram Candela M.D.   On: 09/30/2022 22:04    Procedures  Procedures    Medications Ordered in ED Medications - No data to display  ED Course/ Medical Decision Making/ A&P    Patient seen and examined. History obtained directly from patient. Work-up including labs, imaging, EKG ordered in triage, if performed, were reviewed.    Labs/EKG: Independently reviewed and interpreted.  This included: CBC with elevated white blood cell count at 12.7 otherwise unremarkable with normal hemoglobin; BMP unremarkable; first troponin 5.  EKG personally reviewed and interpreted as above.  Imaging: Independently visualized and interpreted.  This included: Chest x-ray, agree negative.  Medications/Fluids: Ordered: None ordered.  Patient currently asymptomatic.  Most recent vital signs reviewed and are as follows: BP (!) 149/104   Pulse 93   Temp 97.7 F (36.5 C)   Resp 18   Ht 5\' 8"  (1.727 m)   Wt 113.4 kg   SpO2 98%   BMI 38.01 kg/m   Initial impression: Atypical chest pain, now resolved, reassuring workup to this point, awaiting second troponin.  11:40 PM Reassessment performed. Patient appears stable, comfortable.  Labs personally reviewed and interpreted including: Troponin 5 >> 5.   Reviewed pertinent lab work and imaging with patient at bedside. Questions answered.   Most current vital signs reviewed and are as follows: BP (!) 149/104   Pulse 93   Temp 97.7 F (36.5 C)   Resp 18   Ht 5\' 8"  (1.727 m)   Wt 113.4 kg   SpO2 98%   BMI 38.01 kg/m   Plan: Discharge to home.   Return and follow-up instructions: I encouraged patient to return  to ED with severe chest pain, especially if the pain is crushing or pressure-like and spreads to the arms, back, neck, or jaw, or if they have associated sweating, vomiting, or shortness of breath with the pain, or significant pain with activity. We discussed that the evaluation here today indicates a low-risk of serious cause of chest pain, including heart trouble or a blood clot, but no evaluation is perfect and chest pain can evolve with time. The patient verbalized understanding and agreed.  I encouraged patient to follow-up with their provider in the next 48 hours for recheck.                                  Medical Decision Making Amount and/or Complexity of Data Reviewed Labs: ordered. Radiology: ordered.   For this patient's complaint of chest pain, the following emergent conditions were considered on the differential diagnosis: acute coronary syndrome, pulmonary embolism, pneumothorax, myocarditis, pericardial tamponade, aortic dissection, thoracic aortic aneurysm complication, esophageal perforation.   Other causes were also considered including: gastroesophageal reflux disease, musculoskeletal pain including costochondritis, pneumonia/pleurisy, herpes zoster, pericarditis.  In regards to possibility of ACS, patient has atypical features of pain, non-ischemic and unchanged EKG and negative troponin(s). Heart score was calculated to be 2.   In regards to possibility of PE, symptoms are atypical for PE and risk profile is low, patient is PERC negative and chance of PE < 2%.   Possible likely etiologies MSK pain, GI such as esophageal spasm, with work stressors contributing.  The patient's vital signs, pertinent lab work and imaging were reviewed and interpreted as discussed in the ED course. Hospitalization was considered for further testing, treatments, or serial exams/observation. However as patient is well-appearing, has a stable exam, and reassuring studies today, I do not feel that  they warrant admission at this time. This  plan was discussed with the patient who verbalizes agreement and comfort with this plan and seems reliable and able to return to the Emergency Department with worsening or changing symptoms.          Final Clinical Impression(s) / ED Diagnoses Final diagnoses:  Precordial pain  Acute pain of left shoulder    Rx / DC Orders ED Discharge Orders     None         Renne Crigler, PA-C 09/30/22 2341    Lonell Grandchild, MD 10/02/22 [phone]

## 2022-09-30 NOTE — ED Triage Notes (Signed)
Left side Chest pain started ago, was at work. Radiates to left side of neck towards the back. No other symptoms

## 2022-09-30 NOTE — Discharge Instructions (Signed)
Please read and follow all provided instructions.  Your diagnoses today include:  1. Precordial pain   2. Acute pain of left shoulder     Tests performed today include: An EKG of your heart:  A chest x-ray Cardiac enzymes - a blood test for heart muscle damage, both were negative Blood counts and electrolytes Vital signs. See below for your results today.   Medications prescribed:  None  Take any prescribed medications only as directed.  Follow-up instructions: Please follow-up with your primary care provider as soon as you can for further evaluation of your symptoms.   Return instructions:  SEEK IMMEDIATE MEDICAL ATTENTION IF: You have severe chest pain, especially if the pain is crushing or pressure-like and spreads to the arms, back, neck, or jaw, or if you have sweating, nausea or vomiting, or trouble with breathing. THIS IS AN EMERGENCY. Do not wait to see if the pain will go away. Get medical help at once. Call 911. DO NOT drive yourself to the hospital.  Your chest pain gets worse and does not go away after a few minutes of rest.  You have an attack of chest pain lasting longer than what you usually experience.  You have significant dizziness, if you pass out, or have trouble walking.  You have chest pain not typical of your usual pain for which you originally saw your caregiver.  You have any other emergent concerns regarding your health.  Additional Information: Chest pain comes from many different causes. Your caregiver has diagnosed you as having chest pain that is not specific for one problem, but does not require admission.  You are at low risk for an acute heart condition or other serious illness.   Your vital signs today were: BP (!) 149/104   Pulse 93   Temp 97.7 F (36.5 C)   Resp 18   Ht 5\' 8"  (1.727 m)   Wt 113.4 kg   SpO2 98%   BMI 38.01 kg/m  If your blood pressure (BP) was elevated above 135/85 this visit, please have this repeated by your doctor  within one month. --------------

## 2022-10-02 ENCOUNTER — Telehealth: Payer: Self-pay | Admitting: Family Medicine

## 2022-10-02 ENCOUNTER — Encounter: Payer: No Typology Code available for payment source | Admitting: Family Medicine

## 2022-10-02 NOTE — Telephone Encounter (Signed)
Pt was a no show for a cpe with Dr Doreene Burke on 10/02/22, I did send a letter.

## 2022-10-05 DIAGNOSIS — F1120 Opioid dependence, uncomplicated: Secondary | ICD-10-CM | POA: Diagnosis not present

## 2022-10-05 DIAGNOSIS — Z7189 Other specified counseling: Secondary | ICD-10-CM | POA: Diagnosis not present

## 2022-10-05 DIAGNOSIS — F411 Generalized anxiety disorder: Secondary | ICD-10-CM | POA: Diagnosis not present

## 2022-10-07 NOTE — Telephone Encounter (Signed)
2nd missed visit, final warning sent via mail and mychart   Pt has not rescheduled yet

## 2022-10-15 DIAGNOSIS — Z7189 Other specified counseling: Secondary | ICD-10-CM | POA: Diagnosis not present

## 2022-10-15 DIAGNOSIS — F1120 Opioid dependence, uncomplicated: Secondary | ICD-10-CM | POA: Diagnosis not present

## 2022-10-15 DIAGNOSIS — F411 Generalized anxiety disorder: Secondary | ICD-10-CM | POA: Diagnosis not present

## 2022-10-21 DIAGNOSIS — F17200 Nicotine dependence, unspecified, uncomplicated: Secondary | ICD-10-CM | POA: Diagnosis not present

## 2022-10-21 DIAGNOSIS — I10 Essential (primary) hypertension: Secondary | ICD-10-CM | POA: Diagnosis not present

## 2022-10-21 DIAGNOSIS — L4052 Psoriatic arthritis mutilans: Secondary | ICD-10-CM | POA: Diagnosis not present

## 2022-10-21 DIAGNOSIS — G4762 Sleep related leg cramps: Secondary | ICD-10-CM | POA: Diagnosis not present

## 2022-10-21 DIAGNOSIS — F349 Persistent mood [affective] disorder, unspecified: Secondary | ICD-10-CM | POA: Diagnosis not present

## 2022-10-21 DIAGNOSIS — F5221 Male erectile disorder: Secondary | ICD-10-CM | POA: Diagnosis not present

## 2022-10-21 DIAGNOSIS — F1120 Opioid dependence, uncomplicated: Secondary | ICD-10-CM | POA: Diagnosis not present

## 2022-10-21 DIAGNOSIS — F1110 Opioid abuse, uncomplicated: Secondary | ICD-10-CM | POA: Diagnosis not present

## 2022-10-21 DIAGNOSIS — F411 Generalized anxiety disorder: Secondary | ICD-10-CM | POA: Diagnosis not present

## 2022-10-21 DIAGNOSIS — F1091 Alcohol use, unspecified, in remission: Secondary | ICD-10-CM | POA: Diagnosis not present

## 2022-10-21 DIAGNOSIS — F1211 Cannabis abuse, in remission: Secondary | ICD-10-CM | POA: Diagnosis not present

## 2022-10-21 DIAGNOSIS — F605 Obsessive-compulsive personality disorder: Secondary | ICD-10-CM | POA: Diagnosis not present

## 2022-10-29 DIAGNOSIS — F1120 Opioid dependence, uncomplicated: Secondary | ICD-10-CM | POA: Diagnosis not present

## 2022-10-29 DIAGNOSIS — F411 Generalized anxiety disorder: Secondary | ICD-10-CM | POA: Diagnosis not present

## 2022-10-29 DIAGNOSIS — Z7189 Other specified counseling: Secondary | ICD-10-CM | POA: Diagnosis not present

## 2022-11-05 ENCOUNTER — Other Ambulatory Visit (HOSPITAL_BASED_OUTPATIENT_CLINIC_OR_DEPARTMENT_OTHER): Payer: Self-pay

## 2022-11-05 ENCOUNTER — Other Ambulatory Visit: Payer: Self-pay

## 2022-11-05 DIAGNOSIS — F1120 Opioid dependence, uncomplicated: Secondary | ICD-10-CM | POA: Diagnosis not present

## 2022-11-05 DIAGNOSIS — Z7189 Other specified counseling: Secondary | ICD-10-CM | POA: Diagnosis not present

## 2022-11-05 DIAGNOSIS — F411 Generalized anxiety disorder: Secondary | ICD-10-CM | POA: Diagnosis not present

## 2022-11-12 DIAGNOSIS — F1120 Opioid dependence, uncomplicated: Secondary | ICD-10-CM | POA: Diagnosis not present

## 2022-11-12 DIAGNOSIS — Z7189 Other specified counseling: Secondary | ICD-10-CM | POA: Diagnosis not present

## 2022-11-12 DIAGNOSIS — F411 Generalized anxiety disorder: Secondary | ICD-10-CM | POA: Diagnosis not present

## 2022-11-13 DIAGNOSIS — L409 Psoriasis, unspecified: Secondary | ICD-10-CM | POA: Diagnosis not present

## 2022-11-13 DIAGNOSIS — G4733 Obstructive sleep apnea (adult) (pediatric): Secondary | ICD-10-CM | POA: Diagnosis not present

## 2022-11-13 DIAGNOSIS — F419 Anxiety disorder, unspecified: Secondary | ICD-10-CM | POA: Diagnosis not present

## 2022-11-13 DIAGNOSIS — R5383 Other fatigue: Secondary | ICD-10-CM | POA: Diagnosis not present

## 2022-11-13 DIAGNOSIS — Z0000 Encounter for general adult medical examination without abnormal findings: Secondary | ICD-10-CM | POA: Diagnosis not present

## 2022-11-13 DIAGNOSIS — D84821 Immunodeficiency due to drugs: Secondary | ICD-10-CM | POA: Diagnosis not present

## 2022-11-13 DIAGNOSIS — Z6838 Body mass index (BMI) 38.0-38.9, adult: Secondary | ICD-10-CM | POA: Diagnosis not present

## 2022-11-13 DIAGNOSIS — I10 Essential (primary) hypertension: Secondary | ICD-10-CM | POA: Diagnosis not present

## 2022-11-13 DIAGNOSIS — Z79899 Other long term (current) drug therapy: Secondary | ICD-10-CM | POA: Diagnosis not present

## 2022-11-13 DIAGNOSIS — L4050 Arthropathic psoriasis, unspecified: Secondary | ICD-10-CM | POA: Diagnosis not present

## 2022-11-13 DIAGNOSIS — Z1331 Encounter for screening for depression: Secondary | ICD-10-CM | POA: Diagnosis not present

## 2022-11-19 DIAGNOSIS — F1120 Opioid dependence, uncomplicated: Secondary | ICD-10-CM | POA: Diagnosis not present

## 2022-11-19 DIAGNOSIS — Z7189 Other specified counseling: Secondary | ICD-10-CM | POA: Diagnosis not present

## 2022-11-19 DIAGNOSIS — F411 Generalized anxiety disorder: Secondary | ICD-10-CM | POA: Diagnosis not present

## 2022-11-24 DIAGNOSIS — F5221 Male erectile disorder: Secondary | ICD-10-CM | POA: Diagnosis not present

## 2022-11-24 DIAGNOSIS — F349 Persistent mood [affective] disorder, unspecified: Secondary | ICD-10-CM | POA: Diagnosis not present

## 2022-11-24 DIAGNOSIS — G4762 Sleep related leg cramps: Secondary | ICD-10-CM | POA: Diagnosis not present

## 2022-11-24 DIAGNOSIS — F17200 Nicotine dependence, unspecified, uncomplicated: Secondary | ICD-10-CM | POA: Diagnosis not present

## 2022-11-24 DIAGNOSIS — I10 Essential (primary) hypertension: Secondary | ICD-10-CM | POA: Diagnosis not present

## 2022-11-24 DIAGNOSIS — L4052 Psoriatic arthritis mutilans: Secondary | ICD-10-CM | POA: Diagnosis not present

## 2022-11-24 DIAGNOSIS — F1091 Alcohol use, unspecified, in remission: Secondary | ICD-10-CM | POA: Diagnosis not present

## 2022-11-24 DIAGNOSIS — F605 Obsessive-compulsive personality disorder: Secondary | ICD-10-CM | POA: Diagnosis not present

## 2022-11-24 DIAGNOSIS — F1110 Opioid abuse, uncomplicated: Secondary | ICD-10-CM | POA: Diagnosis not present

## 2022-11-24 DIAGNOSIS — F411 Generalized anxiety disorder: Secondary | ICD-10-CM | POA: Diagnosis not present

## 2022-11-24 DIAGNOSIS — F1211 Cannabis abuse, in remission: Secondary | ICD-10-CM | POA: Diagnosis not present

## 2022-11-24 DIAGNOSIS — F1120 Opioid dependence, uncomplicated: Secondary | ICD-10-CM | POA: Diagnosis not present

## 2022-12-03 DIAGNOSIS — F411 Generalized anxiety disorder: Secondary | ICD-10-CM | POA: Diagnosis not present

## 2022-12-03 DIAGNOSIS — F1120 Opioid dependence, uncomplicated: Secondary | ICD-10-CM | POA: Diagnosis not present

## 2022-12-03 DIAGNOSIS — Z7189 Other specified counseling: Secondary | ICD-10-CM | POA: Diagnosis not present

## 2022-12-17 DIAGNOSIS — Z7189 Other specified counseling: Secondary | ICD-10-CM | POA: Diagnosis not present

## 2022-12-17 DIAGNOSIS — F1120 Opioid dependence, uncomplicated: Secondary | ICD-10-CM | POA: Diagnosis not present

## 2022-12-17 DIAGNOSIS — F411 Generalized anxiety disorder: Secondary | ICD-10-CM | POA: Diagnosis not present

## 2023-01-05 DIAGNOSIS — D123 Benign neoplasm of transverse colon: Secondary | ICD-10-CM | POA: Diagnosis not present

## 2023-01-05 DIAGNOSIS — K621 Rectal polyp: Secondary | ICD-10-CM | POA: Diagnosis not present

## 2023-01-05 DIAGNOSIS — Z860101 Personal history of adenomatous and serrated colon polyps: Secondary | ICD-10-CM | POA: Diagnosis not present

## 2023-01-05 DIAGNOSIS — D128 Benign neoplasm of rectum: Secondary | ICD-10-CM | POA: Diagnosis not present

## 2023-01-11 ENCOUNTER — Encounter: Payer: Self-pay | Admitting: *Deleted

## 2023-01-12 ENCOUNTER — Institutional Professional Consult (permissible substitution): Payer: 59 | Admitting: Neurology

## 2023-01-14 DIAGNOSIS — F1221 Cannabis dependence, in remission: Secondary | ICD-10-CM | POA: Diagnosis not present

## 2023-01-14 DIAGNOSIS — F1021 Alcohol dependence, in remission: Secondary | ICD-10-CM | POA: Diagnosis not present

## 2023-01-14 DIAGNOSIS — F1121 Opioid dependence, in remission: Secondary | ICD-10-CM | POA: Diagnosis not present

## 2023-01-14 DIAGNOSIS — F411 Generalized anxiety disorder: Secondary | ICD-10-CM | POA: Diagnosis not present

## 2023-01-14 DIAGNOSIS — F349 Persistent mood [affective] disorder, unspecified: Secondary | ICD-10-CM | POA: Diagnosis not present

## 2023-01-14 DIAGNOSIS — Z7189 Other specified counseling: Secondary | ICD-10-CM | POA: Diagnosis not present

## 2023-01-27 DIAGNOSIS — M7551 Bursitis of right shoulder: Secondary | ICD-10-CM | POA: Diagnosis not present

## 2023-01-27 DIAGNOSIS — E669 Obesity, unspecified: Secondary | ICD-10-CM | POA: Diagnosis not present

## 2023-01-27 DIAGNOSIS — M25511 Pain in right shoulder: Secondary | ICD-10-CM | POA: Diagnosis not present

## 2023-02-03 DIAGNOSIS — M7551 Bursitis of right shoulder: Secondary | ICD-10-CM | POA: Diagnosis not present

## 2023-02-03 DIAGNOSIS — F349 Persistent mood [affective] disorder, unspecified: Secondary | ICD-10-CM | POA: Diagnosis not present

## 2023-02-03 DIAGNOSIS — F1121 Opioid dependence, in remission: Secondary | ICD-10-CM | POA: Diagnosis not present

## 2023-02-03 DIAGNOSIS — F1021 Alcohol dependence, in remission: Secondary | ICD-10-CM | POA: Diagnosis not present

## 2023-02-03 DIAGNOSIS — F411 Generalized anxiety disorder: Secondary | ICD-10-CM | POA: Diagnosis not present

## 2023-02-03 DIAGNOSIS — F1221 Cannabis dependence, in remission: Secondary | ICD-10-CM | POA: Diagnosis not present

## 2023-02-03 DIAGNOSIS — Z7189 Other specified counseling: Secondary | ICD-10-CM | POA: Diagnosis not present

## 2023-02-03 DIAGNOSIS — M24811 Other specific joint derangements of right shoulder, not elsewhere classified: Secondary | ICD-10-CM | POA: Diagnosis not present

## 2023-02-03 DIAGNOSIS — M19012 Primary osteoarthritis, left shoulder: Secondary | ICD-10-CM | POA: Diagnosis not present

## 2023-02-04 ENCOUNTER — Encounter: Payer: Self-pay | Admitting: Neurology

## 2023-02-04 ENCOUNTER — Ambulatory Visit: Payer: No Typology Code available for payment source | Admitting: Neurology

## 2023-02-04 VITALS — BP 170/120 | Ht 68.0 in | Wt 266.2 lb

## 2023-02-04 DIAGNOSIS — I10 Essential (primary) hypertension: Secondary | ICD-10-CM

## 2023-02-04 DIAGNOSIS — F17200 Nicotine dependence, unspecified, uncomplicated: Secondary | ICD-10-CM | POA: Diagnosis not present

## 2023-02-04 DIAGNOSIS — G4733 Obstructive sleep apnea (adult) (pediatric): Secondary | ICD-10-CM

## 2023-02-04 DIAGNOSIS — R635 Abnormal weight gain: Secondary | ICD-10-CM | POA: Diagnosis not present

## 2023-02-04 DIAGNOSIS — R0683 Snoring: Secondary | ICD-10-CM

## 2023-02-04 DIAGNOSIS — R030 Elevated blood-pressure reading, without diagnosis of hypertension: Secondary | ICD-10-CM

## 2023-02-04 DIAGNOSIS — Z789 Other specified health status: Secondary | ICD-10-CM
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
## 2023-02-04 NOTE — Patient Instructions (Signed)

## 2023-02-12 DIAGNOSIS — F349 Persistent mood [affective] disorder, unspecified: Secondary | ICD-10-CM | POA: Diagnosis not present

## 2023-02-12 DIAGNOSIS — F1221 Cannabis dependence, in remission: Secondary | ICD-10-CM | POA: Diagnosis not present

## 2023-02-12 DIAGNOSIS — F1121 Opioid dependence, in remission: Secondary | ICD-10-CM | POA: Diagnosis not present

## 2023-02-12 DIAGNOSIS — F411 Generalized anxiety disorder: Secondary | ICD-10-CM | POA: Diagnosis not present

## 2023-02-12 DIAGNOSIS — Z7189 Other specified counseling: Secondary | ICD-10-CM | POA: Diagnosis not present

## 2023-02-12 DIAGNOSIS — F1021 Alcohol dependence, in remission: Secondary | ICD-10-CM | POA: Diagnosis not present

## 2023-02-17 DIAGNOSIS — Z79620 Long term (current) use of immunosuppressive biologic: Secondary | ICD-10-CM | POA: Diagnosis not present

## 2023-02-17 DIAGNOSIS — L409 Psoriasis, unspecified: Secondary | ICD-10-CM | POA: Diagnosis not present

## 2023-02-17 DIAGNOSIS — L4059 Other psoriatic arthropathy: Secondary | ICD-10-CM | POA: Diagnosis not present

## 2023-02-17 DIAGNOSIS — Z79899 Other long term (current) drug therapy: Secondary | ICD-10-CM | POA: Diagnosis not present

## 2023-02-19 ENCOUNTER — Ambulatory Visit: Payer: No Typology Code available for payment source | Admitting: Neurology

## 2023-02-19 DIAGNOSIS — F1121 Opioid dependence, in remission: Secondary | ICD-10-CM | POA: Diagnosis not present

## 2023-02-19 DIAGNOSIS — R030 Elevated blood-pressure reading, without diagnosis of hypertension: Secondary | ICD-10-CM

## 2023-02-19 DIAGNOSIS — Z789 Other specified health status: Secondary | ICD-10-CM

## 2023-02-19 DIAGNOSIS — F1021 Alcohol dependence, in remission: Secondary | ICD-10-CM | POA: Diagnosis not present

## 2023-02-19 DIAGNOSIS — G4733 Obstructive sleep apnea (adult) (pediatric): Secondary | ICD-10-CM | POA: Diagnosis not present

## 2023-02-19 DIAGNOSIS — Z7189 Other specified counseling: Secondary | ICD-10-CM | POA: Diagnosis not present

## 2023-02-19 DIAGNOSIS — F17200 Nicotine dependence, unspecified, uncomplicated: Secondary | ICD-10-CM

## 2023-02-19 DIAGNOSIS — F411 Generalized anxiety disorder: Secondary | ICD-10-CM | POA: Diagnosis not present

## 2023-02-19 DIAGNOSIS — I10 Essential (primary) hypertension: Secondary | ICD-10-CM

## 2023-02-19 DIAGNOSIS — F349 Persistent mood [affective] disorder, unspecified: Secondary | ICD-10-CM | POA: Diagnosis not present

## 2023-02-19 DIAGNOSIS — R635 Abnormal weight gain: Secondary | ICD-10-CM

## 2023-02-19 DIAGNOSIS — R0683 Snoring: Secondary | ICD-10-CM

## 2023-02-19 DIAGNOSIS — F1221 Cannabis dependence, in remission: Secondary | ICD-10-CM | POA: Diagnosis not present

## 2023-03-10 DIAGNOSIS — S43431A Superior glenoid labrum lesion of right shoulder, initial encounter: Secondary | ICD-10-CM | POA: Diagnosis not present

## 2023-03-10 DIAGNOSIS — M19019 Primary osteoarthritis, unspecified shoulder: Secondary | ICD-10-CM | POA: Diagnosis not present

## 2023-03-30 DIAGNOSIS — M24811 Other specific joint derangements of right shoulder, not elsewhere classified: Secondary | ICD-10-CM | POA: Diagnosis not present

## 2023-04-01 ENCOUNTER — Other Ambulatory Visit: Payer: Self-pay

## 2023-04-01 ENCOUNTER — Ambulatory Visit: Payer: No Typology Code available for payment source | Admitting: Radiology

## 2023-04-01 ENCOUNTER — Ambulatory Visit
Admission: RE | Admit: 2023-04-01 | Discharge: 2023-04-01 | Disposition: A | Discharge status: Home / Self Care | Payer: No Typology Code available for payment source | Source: Ambulatory Visit | Attending: Family Medicine | Admitting: Family Medicine

## 2023-04-01 VITALS — BP 139/85 | HR 127 | Temp 100.1°F | Resp 18 | Ht 68.0 in | Wt 270.0 lb

## 2023-04-01 DIAGNOSIS — J189 Pneumonia, unspecified organism: Secondary | ICD-10-CM

## 2023-04-01 DIAGNOSIS — R051 Acute cough: Secondary | ICD-10-CM

## 2023-04-01 DIAGNOSIS — R509 Fever, unspecified: Secondary | ICD-10-CM | POA: Diagnosis not present

## 2023-04-01 DIAGNOSIS — R059 Cough, unspecified: Secondary | ICD-10-CM | POA: Diagnosis not present

## 2023-04-01 LAB — POC COVID19/FLU A&B COMBO
Covid Antigen, POC: NEGATIVE
Influenza A Antigen, POC: NEGATIVE
Influenza B Antigen, POC: NEGATIVE

## 2023-04-01 MED ORDER — AMOXICILLIN 500 MG PO CAPS: 1000.0000 mg | ORAL_CAPSULE | Freq: Two times a day (BID) | ORAL | 0 refills | Status: AC

## 2023-04-01 MED ORDER — PROMETHAZINE-DM 6.25-15 MG/5ML PO SYRP: 5.0000 mL | ORAL_SOLUTION | Freq: Four times a day (QID) | ORAL | 0 refills | Status: AC | PRN

## 2023-04-01 MED ORDER — AZITHROMYCIN 250 MG PO TABS: 250.0000 mg | ORAL_TABLET | Freq: Every day | ORAL | 0 refills | Status: AC

## 2023-04-01 MED ORDER — ACETAMINOPHEN 325 MG PO TABS
975.0000 mg | ORAL_TABLET | Freq: Once | ORAL | Status: AC
  Administered 2023-04-01: 975 mg via ORAL

## 2023-04-01 NOTE — ED Triage Notes (Signed)
Pt presents with complaints of fatigue x 5 days. Pt is also reporting cough and "burning in chest" since yesterday 1/29. OTC decongestant taken, made pt very drowsy. Pt currently rates his overall pain a 5/10.

## 2023-04-01 NOTE — ED Provider Notes (Signed)
[facility]    CSN: [id number] Arrival date & time: 04/01/23  1729      History   Chief Complaint Chief Complaint  Patient presents with   Fatigue    Extreme fatigue - Entered by patient   Fever    HPI Rane Blitch is a 43 y.o. male.    Fever Associated symptoms: chest pain, chills, congestion, cough, diarrhea, headaches and sore throat   Associated symptoms: no nausea and no rhinorrhea   Not feeling well since yesterday has had fatigue, body, fever, nonproductive cough, burning sensation in chest when he coughs.  Son has been sick. Admits to tickle in the throat and some nasal congestion.  Admits diarrhea.  Denies abdominal pain, nausea, recent travel. Not currently working.  Past medical history hypertension.  Past Medical History:  Diagnosis Date   Allergy    Anxiety    Ear congestion, bilateral 03/08/2017   Last Assessment & Plan:  Patient notes recurrent infections in the past. States he has a congestion/fluid-like feeling in the [place] today. States that his been going on for a couple weeks. Notes that it is concurrent with increase in fatigue. Denies any true pain. Denies any fever. No recent upper respiratory infection symptoms. Physical exam does not reveal any signs of infection or fluid behind    Essential hypertension 06/28/2014   Fatigue 03/08/2017   Last Assessment & Plan:  Patient notices new onset of fatigue 4 weeks ago. He states that there is no changes in his routine. He denies any changes in medication or diet. No evidence of active bleeding. Does note that he is noncompliant with his CPAP machine. Advised that we would do blood work today. Patient refused and states that he would like to repeat in 4 weeks as he has not had anything to    Herniated disc    Hypertension    Insomnia 03/08/2017   Last Assessment & Plan:  Patient notes some difficulty sleeping when he is trying to wear his CPAP machine. He states that also he has a little bit of  restlessness at night. Is inquiring for a sleep aid to help him with these symptoms. Has tried melatonin in the past. Will prescribe Rozerem 8 mg tablets as a starting point to help with difficulty sleeping, in order to try and avoid increasing fati   Non-cardiac chest pain    Numbness and tingling in right hand 03/08/2017   Last Assessment & Plan:  Occurs mostly at night. Noticing symptoms are worsening. Notices some tingling and numbness in the right hand. Has not used any treatments thus far. Advised to use a wrist brace at night to help with likely carpal tunnel-like symptoms. Advised to follow up in 4 weeks.   Obese    Obesity (BMI 35.0-39.9 without comorbidity) 06/25/2015   OSA (obstructive sleep apnea) 09/30/2015   Overview:  08/2015  HSAT  AHI 21.4  Sat 80 % Average Sat 92%  Last Assessment & Plan:  Patient previously diagnosed. Noncompliant with CPAP at this time. Expereincing increased fatigue. Notes excessive daytime tiredness. States that he has difficulty sleeping with CPAP mask. Is recommended to wear the full mask as he sleeps with his mouth open. Has not been back to see a sleep doctor recently. [place]   Psoriasis 06/28/2014   Under care of dermatologist, Dr Andrey Campanile    Rhinitis, allergic 06/28/2014   Shoulder pain    left   Smoker    Tobacco use 04/09/2017   Last  Assessment & Plan:  Smoking cessation desired. Previously failed chantix due to irritability. > 1PPD. 21 pack-year history. Will start bupropion 150mg  for 3 days and increase to 300mg . Patient reports readiness for quitting. Offered talk therapy referral. 1800 QUIT LINE as well. Patient will try medications. Advised to pick a quit date, use other healthy habits to replace oral fixation during    Patient Active Problem List   Diagnosis Date Noted   Chronic low back pain 10/30/2021   Healthcare maintenance 10/30/2021   Tobacco use 04/09/2017   Ear congestion, bilateral 03/08/2017   Fatigue 03/08/2017   Insomnia 03/08/2017    Numbness and tingling in right hand 03/08/2017   OSA (obstructive sleep apnea) 09/30/2015   Obesity (BMI 35.0-39.9 without comorbidity) 06/25/2015   Rhinitis, allergic 06/28/2014   Psoriasis 06/28/2014   Essential hypertension 06/28/2014    Past Surgical History:  Procedure Laterality Date   TENDON REPAIR Right    R index finger tendon repair        Home Medications    Prior to Admission medications   Medication Sig Start Date End Date Taking? Authorizing Provider  adalimumab (HUMIRA, 2 SYRINGE,) 40 MG/0.4ML prefilled syringe Inject 0.4 mLs into the skin every 14 (fourteen) days.    [provider]  amLODipine (NORVASC) 10 MG tablet Take 1 tablet (10 mg total) by mouth daily. 07/20/22   Mliss Sax, MD  benazepril (LOTENSIN) 40 MG tablet Take 1 tablet (40 mg total) by mouth daily. 06/23/21     Brexpiprazole 0.5 MG TABS Take 0.5 mg by mouth daily.    [provider]  hydrochlorothiazide (MICROZIDE) 12.5 MG capsule Take 1 capsule (12.5 mg total) by mouth daily. 07/20/22   Mliss Sax, MD  nitroGLYCERIN (NITROSTAT) 0.4 MG SL tablet Place 1 tablet (0.4 mg total) under the tongue every 5 (five) minutes as needed for chest pain. 05/13/17 10/30/21  Baldo Daub, MD  PARoxetine (PAXIL) 20 MG tablet Take 20 mg by mouth daily.    [provider]  eplerenone (INSPRA) 50 MG tablet Take 1 tablet (50 mg total) by mouth daily. 05/13/17 07/25/19  Baldo Daub, MD  metoprolol tartrate (LOPRESSOR) 50 MG tablet Take 1 tablet (50 mg total) by mouth once for 1 dose. Take 1 hour before cardiac CTA. 05/13/17 07/25/19  Baldo Daub, MD    Family History Family History  Problem Relation Age of Onset   Heart attack Father    Stroke Father    Hypertension Father    Diabetes Father    Heart attack Paternal Uncle    Anxiety disorder Mother    Cancer Mother        breast   Dementia Paternal Grandmother     Social History Social History   Tobacco  Use   Smoking status: Every Day    Current packs/day: 1.00    Types: Cigarettes   Smokeless tobacco: Never  Vaping Use   Vaping status: Former  Substance Use Topics   Alcohol use: Not Currently   Drug use: No     Allergies   Doxycycline   Review of Systems Review of Systems  Constitutional:  Positive for appetite change, chills, diaphoresis, fatigue and fever.  HENT:  Positive for congestion and sore throat. Negative for rhinorrhea.   Respiratory:  Positive for cough. Negative for shortness of breath and wheezing.   Cardiovascular:  Positive for chest pain.  Gastrointestinal:  Positive for diarrhea. Negative for abdominal pain and nausea.  Neurological:  Positive for headaches.     Physical Exam Triage Vital Signs ED Triage Vitals  Encounter Vitals Group     BP 04/01/23 1802 139/85     Systolic BP Percentile --      Diastolic BP Percentile --      Pulse Rate 04/01/23 1802 (!) 127     Resp 04/01/23 1802 18     Temp 04/01/23 1802 100.1 F (37.8 C)     Temp Source 04/01/23 1802 Oral     SpO2 04/01/23 1802 94 %     Weight 04/01/23 1809 270 lb (122.5 kg)     Height 04/01/23 1809 5\' 8"  (1.727 m)     Head Circumference --      Peak Flow --      Pain Score 04/01/23 1808 5     Pain Loc --      Pain Education --      Exclude from Growth Chart --    No data found.  Updated Vital Signs BP 139/85 (BP Location: Right Arm)   Pulse (!) 127   Temp 100.1 F (37.8 C) (Oral)   Resp 18   Ht 5\' 8"  (1.727 m)   Wt 270 lb (122.5 kg)   SpO2 94%   BMI 41.05 kg/m   Visual Acuity Right Eye Distance:   Left Eye Distance:   Bilateral Distance:    Right Eye Near:   Left Eye Near:    Bilateral Near:     Physical Exam Vitals and nursing note reviewed.  Constitutional:      Appearance: He is not ill-appearing.  HENT:     Head: Normocephalic and atraumatic.     Right Ear: Tympanic membrane and ear canal normal.     Left Ear: Tympanic membrane and ear canal normal.      Nose: No rhinorrhea.     Mouth/Throat:     Mouth: Mucous membranes are moist.     Pharynx: Oropharynx is clear. No posterior oropharyngeal erythema.  Eyes:     Conjunctiva/sclera: Conjunctivae normal.  Cardiovascular:     Rate and Rhythm: Regular rhythm. Tachycardia present.     Heart sounds: Normal heart sounds.  Pulmonary:     Effort: Pulmonary effort is normal. No respiratory distress.     Breath sounds: Normal breath sounds. No wheezing or rales.  Musculoskeletal:     Cervical back: Neck supple.  Lymphadenopathy:     Cervical: No cervical adenopathy.  Skin:    General: Skin is warm and dry.  Neurological:     Mental Status: He is alert and oriented to person, place, and time.  Psychiatric:        Mood and Affect: Mood normal.      UC Treatments / Results  Labs (all labs ordered are listed, but only abnormal results are displayed) Labs Reviewed - No data to display  EKG   Radiology No results found.  Procedures Procedures (including critical care time)  Medications Ordered in UC Medications - No data to display  Initial Impression / Assessment and Plan / UC Course  I have reviewed the triage vital signs and the nursing notes.  Pertinent labs & imaging results that were available during my care of the patient were reviewed by me and considered in my medical decision making (see chart for details).    43 year old male with loss of appetite chills  fever cough chest congestion sore throat diarrhea headache and extreme fatigue since yesterday, he has low-grade  fever 100.1, tachycardic to 127, oxygen saturation 94%.  Consider bacterial infection on exam, lungs are clear to auscultation.  Point-of-care COVID and flu are negative chest x-ray independently viewed by me no obvious infiltrate.  I have concern for community-acquired pneumonia based on fever, tachycardia, decreased oxygen saturation.  Will initiate antibiotics recommend close follow-up Go to ED for worsening  symptoms  Final Clinical Impressions(s) / UC Diagnoses   Final diagnoses:  None   Discharge Instructions   None    ED Prescriptions   None    PDMP not reviewed this encounter.   Meliton Rattan, [place] 04/01/23 [phone]

## 2023-04-01 NOTE — Discharge Instructions (Addendum)
The x-ray reading we discussed is preliminary. Your x-ray will be read by a radiologist in next few hours. If there is a discrepancy, you will be contacted, and instructed on a new plan for you care.   Recheck by your doctor in a few days Go to the emergency department for severe worsening symptoms or concerns COVID and flu test today were negative

## 2023-04-14 NOTE — Progress Notes (Unsigned)
Marland Kitchen

## 2023-04-15 ENCOUNTER — Other Ambulatory Visit: Payer: Self-pay | Admitting: Neurology

## 2023-04-15 DIAGNOSIS — G4733 Obstructive sleep apnea (adult) (pediatric): Secondary | ICD-10-CM

## 2023-04-15 NOTE — Procedures (Signed)
[facility]  HOME SLEEP TEST (SANSA) REPORT (Mail-Out Device):   STUDY DATE: 03/26/2023  DOB: [birth year]  MRN: [id number]  ORDERING CLINICIAN: [name], MD, PhD   REFERRING CLINICIAN: Reubin Milan, PA  CLINICAL INFORMATION/HISTORY: 43 year old male with an underlying medical history of uncontrolled hypertension, smoking, psoriatic arthritis, allergies, anxiety, depression, and severe obesity with a BMI of over 40, who reports snoring and excessive daytime somnolence, as well as witnessed apneas.  He was previously diagnosed with obstructive sleep apnea but could not tolerate PAP therapy at the time.  PATIENT'S LAST REPORTED EPWORTH SLEEPINESS SCORE (ESS): 7/24.  BMI (at the time of sleep clinic visit and/or test date): 40.5 kg/m  FINDINGS:   Study Protocol:    The SANSA single-point-of-skin-contact chest-worn sensor - an FDA cleared and DOT approved type 4 home sleep test device - measures eight physiological channels,  including blood oxygen saturation (measured via PPG [photoplethysmography]), EKG-derived heart rate, respiratory effort, chest movement (measured via accelerometer), snoring, body position, and actigraphy. The device is designed to be worn for up to 10 hours per study.   Sleep Summary:   Total Recording Time (hours, min): 9 hours, 1 min  Total Effective Sleep Time (hours, min):  6 hours, 59 min  Sleep Efficiency (%):    78%   Respiratory Indices:   Calculated sAHI (per hour):  41.5/hour         Oxygen Saturation Statistics:    Oxygen Saturation (%) Mean: 94.4%   Minimum oxygen saturation (%):                 77%   O2 Saturation Range (%): 77 - 100%   Time below or at 88% saturation: 5 min   Pulse Rate Statistics:   Pulse Mean (bpm):    83/min    Pulse Range (57-123/min)   Snoring: Moderate  IMPRESSION/DIAGNOSES:   OSA (obstructive sleep apnea), severe   RECOMMENDATIONS:   This home sleep test demonstrates severe  obstructive sleep apnea with a total AHI of 41.5/hour and O2 nadir of 77%. Snoring was detected, in the moderate range for the most part.  Treatment with positive airway pressure is highly recommended. The patient will be advised to proceed with an autoPAP titration/trial at home. A laboratory attended titration study can be considered in the future for optimization of treatment settings and to improve tolerance and compliance, if needed, down the road. Alternative treatment options are limited secondary to the severity of the patient's sleep disordered breathing, but may include surgical treatment with an implantable hypoglossal nerve stimulator (in carefully selected candidates, meeting criteria).  Concomitant weight loss is recommended (where clinically appropriate). Please note, that untreated obstructive sleep apnea may carry additional perioperative morbidity. Patients with significant obstructive sleep apnea should receive perioperative PAP therapy and the surgeons and particularly the anesthesiologist should be informed of the diagnosis and the severity of the sleep disordered breathing. The patient should be cautioned not to drive, work at heights, or operate dangerous or heavy equipment when tired or sleepy. Review and reiteration of good sleep hygiene measures should be pursued with any patient. Other causes of the patient's symptoms, including circadian rhythm disturbances, an underlying mood disorder, medication effect and/or an underlying medical problem cannot be ruled out based on this test. Clinical correlation is recommended.  The patient and his referring provider will be notified of the test results. The patient will be seen in follow up in sleep clinic at [facility].  I certify that I  have reviewed the raw data recording prior to the issuance of this report in accordance with the standards of the American Academy of Sleep Medicine (AASM).    INTERPRETING PHYSICIAN:   [name], MD,  PhD Medical Director, [facility]) Diplomat, ABPN (Neurology and Sleep)   [facility] [address] [place], [place] [address] [phone]

## 2023-04-19 ENCOUNTER — Telehealth: Payer: Self-pay | Admitting: *Deleted

## 2023-04-19 NOTE — Telephone Encounter (Signed)
-----   Message from [name] sent at 04/15/2023  5:05 PM EST ----- Urgent set up requested on PAP therapy, due to severe OSA.  Patient referred by PCP PA, seen by me on 02/04/2023, patient had a HST on 03/26/2023.    Please call and notify the patient that the recent home sleep test showed obstructive sleep apnea in the severe range. I recommend treatment for this in the form of autoPAP, which means, that we don't have to bring him in for a sleep study with CPAP, but will let him start using a so called autoPAP machine at home, through a DME company (of his choice, or as per insurance requirement). The DME representative will fit the patient with a mask of choice, educate him on how to use the machine, how to put the mask on, etc. I have placed an order in the chart. Please send the order to a local DME, talk to patient, send report to referring MD. Please also reinforce the need for compliance with treatment. We will need a FU in sleep clinic for 10 weeks post-PAP set up, please arrange that with me or one of our NPs. Thanks,   [name], MD, PhD [facility])

## 2023-04-19 NOTE — Telephone Encounter (Signed)
 Called pt & LVM asking for call back at his earlier convenience. Left office number and today's hours in message.

## 2023-04-21 NOTE — Telephone Encounter (Signed)
 I called the patient and LVM asking for call back at his earliest convenience to discuss results. Left office number and current adjusted hours due to inclement weather.

## 2023-04-21 NOTE — Telephone Encounter (Signed)
 Zott, Linnell Fulling, Otilio Jefferson, RN; [place], [place] Got It Thank  You

## 2023-04-21 NOTE — Telephone Encounter (Signed)
 The patient returned my call.  We discussed his sleep study results as noted below by Dr. Frances Furbish.  The patient verbalized understanding and is amenable to proceeding with AutoPap set up as soon as possible.  He did not have a preference for DME so we will refer to [facility].  We discussed the insurance compliance requirements which includes using the machine at least 4 hours at night and also being seen by our office between 30 and 90 days after set up.  We scheduled his initial follow-up appointment for 5/20 at 345 pm, mychart video visit.  The patient's questions were answered and he verbalized appreciation for the call.   Urgent referral sent to [facility]. Sleep study report sent to referring provider.

## 2023-04-29 DIAGNOSIS — G4733 Obstructive sleep apnea (adult) (pediatric): Secondary | ICD-10-CM | POA: Diagnosis not present

## 2023-05-27 DIAGNOSIS — G4733 Obstructive sleep apnea (adult) (pediatric): Secondary | ICD-10-CM | POA: Diagnosis not present

## 2023-07-01 DIAGNOSIS — G4733 Obstructive sleep apnea (adult) (pediatric): Secondary | ICD-10-CM | POA: Diagnosis not present

## 2023-07-15 ENCOUNTER — Telehealth: Payer: Self-pay | Admitting: Adult Health

## 2023-07-15 NOTE — Telephone Encounter (Signed)
 MYC conf

## 2023-07-19 NOTE — Progress Notes (Signed)
 [facility] [address] [place]. [place] [address] [phone]       OFFICE FOLLOW UP NOTE  Mr. Darren Caldron Date of Birth:  1980-09-10 Medical Record Number:  [id number]    Primary neurologist: Dr. Omar Bibber Reason for visit: Initial CPAP follow-up  Virtual Visit via Video Note  I connected with Joane Mower on 07/20/23 at  3:45 PM EDT by a video enabled telemedicine application and verified that I am speaking with the correct person using two identifiers.  Location: Patient: at home Provider: in office, [facility]   I discussed the limitations of evaluation and management by telemedicine and the availability of in person appointments. The patient expressed understanding and agreed to proceed.   SUBJECTIVE:   Follow-up visit:  Prior visit: 02/04/2023 with Dr. Omar Bibber (initial consult visit)  Brief HPI:   Keilon Ressel is a 43 y.o. male who was evaluated by Dr. Omar Bibber in 01/2019 for for concern of underlying sleep apnea with an underlying medical history of uncontrolled hypertension, smoking, psoriatic arthritis, allergies, anxiety, depression, and severe obesity with a BMI of over 40, who reported snoring and excessive daytime somnolence, as well as witnessed apneas.  ESS 7/24.  Reported prior diagnosis of sleep apnea in 2017 but no longer on PAP therapy, unable to tolerate after 1 to 2 months trial and interested in retrying for treatment of apnea.  HST 01/2023 showed obstructive severe sleep apnea with total AHI of 41.5/h and O2 nadir of 77%.  AutoPap therapy initiated 04/2023.     Interval history:  Returns for initial CPAP compliance visit via MyChart video visit. Reports gradually improving tolerance to CPAP therapy but still having some difficulty fully tolerating and using consistently more so due to recurrent sinus infections and nasal congestion due to allergies. When he is able to use, he does note benefit in regards to sleep quality and daytime  energy levels.  He will occasionally have leaks but will resolve after readjustment of mask.  Currently using nasal cradle.  No further questions or concerns.         ROS:   14 system review of systems performed and negative with exception of those listed in HPI  PMH:  Past Medical History:  Diagnosis Date   Allergy    Anxiety    Ear congestion, bilateral 03/08/2017   Last Assessment & Plan:  Patient notes recurrent infections in the past. States he has a congestion/fluid-like feeling in the [place] today. States that his been going on for a couple weeks. Notes that it is concurrent with increase in fatigue. Denies any true pain. Denies any fever. No recent upper respiratory infection symptoms. Physical exam does not reveal any signs of infection or fluid behind    Essential hypertension 06/28/2014   Fatigue 03/08/2017   Last Assessment & Plan:  Patient notices new onset of fatigue 4 weeks ago. He states that there is no changes in his routine. He denies any changes in medication or diet. No evidence of active bleeding. Does note that he is noncompliant with his CPAP machine. Advised that we would do blood work today. Patient refused and states that he would like to repeat in 4 weeks as he has not had anything to    Herniated disc    Hypertension    Insomnia 03/08/2017   Last Assessment & Plan:  Patient notes some difficulty sleeping when he is trying to wear his CPAP machine. He states that also he has a little bit of restlessness  at night. Is inquiring for a sleep aid to help him with these symptoms. Has tried melatonin in the past. Will prescribe Rozerem 8 mg tablets as a starting point to help with difficulty sleeping, in order to try and avoid increasing fati   Non-cardiac chest pain    Numbness and tingling in right hand 03/08/2017   Last Assessment & Plan:  Occurs mostly at night. Noticing symptoms are worsening. Notices some tingling and numbness in the right hand. Has not used any  treatments thus far. Advised to use a wrist brace at night to help with likely carpal tunnel-like symptoms. Advised to follow up in 4 weeks.   Obese    Obesity (BMI 35.0-39.9 without comorbidity) 06/25/2015   OSA (obstructive sleep apnea) 09/30/2015   Overview:  08/2015  HSAT  AHI 21.4  Sat 80 % Average Sat 92%  Last Assessment & Plan:  Patient previously diagnosed. Noncompliant with CPAP at this time. Expereincing increased fatigue. Notes excessive daytime tiredness. States that he has difficulty sleeping with CPAP mask. Is recommended to wear the full mask as he sleeps with his mouth open. Has not been back to see a sleep doctor recently. [place]   Psoriasis 06/28/2014   Under care of dermatologist, Dr Elvan Hamel    Rhinitis, allergic 06/28/2014   Shoulder pain    left   Smoker    Tobacco use 04/09/2017   Last Assessment & Plan:  Smoking cessation desired. Previously failed chantix due to irritability. > 1PPD. 21 pack-year history. Will start bupropion  150mg  for 3 days and increase to 300mg . Patient reports readiness for quitting. Offered talk therapy referral. 1800 QUIT LINE as well. Patient will try medications. Advised to pick a quit date, use other healthy habits to replace oral fixation during    PSH:  Past Surgical History:  Procedure Laterality Date   TENDON REPAIR Right    R index finger tendon repair     Social History:  Social History   Socioeconomic History   Marital status: Married    Spouse name: Not on file   Number of children: Not on file   Years of education: Not on file   Highest education level: Not on file  Occupational History   Occupation: maintenance supervisor  Tobacco Use   Smoking status: Every Day    Current packs/day: 1.00    Types: Cigarettes   Smokeless tobacco: Never  Vaping Use   Vaping status: Former  Substance and Sexual Activity   Alcohol use: Not Currently   Drug use: No   Sexual activity: Yes  Other Topics Concern   Not on file  Social  History Narrative   Live home with wife and son.    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)   Social Drivers of Health   Financial Resource Strain: Medium Risk (06/15/2023)   Received from [facility]   Overall Financial Resource Strain (CARDIA)    Difficulty of Paying Living Expenses: Somewhat hard  Food Insecurity: Patient Declined (06/15/2023)   Received from [facility]   Hunger Vital Sign    Worried About Running Out of Food in the Last Year: Patient declined    Ran Out of Food in the Last Year: Patient declined  Transportation Needs: No Transportation Needs (06/15/2023)   Received from [facility]   PRAPARE - Transportation    Lack of Transportation (Medical): No    Lack of Transportation (Non-Medical): No  Physical  Activity: Unknown (06/15/2023)   Received from [facility]   Exercise Vital Sign    Days of Exercise per Week: Patient declined    Minutes of Exercise per Session: 0 min  Stress: No Stress Concern Present (06/15/2023)   Received from [facility] - Occupational Stress Questionnaire    Feeling of Stress : Only a little  Social Connections: Socially Integrated (06/15/2023)   Received from [facility]   Social Network    How would you rate your social network (family, work, friends)?: Good participation with social networks  Intimate Partner Violence: Not At Risk (06/15/2023)   Received from [facility]   HITS    Over the last 12 months how often did your partner physically hurt you?: Never    Over the last 12 months how often did your partner insult you or talk down to you?: Never    Over the last 12 months how often did your partner threaten you with physical harm?: Never    Over the last 12 months how often did your partner scream or curse at you?: Never    Family History:  Family History  Problem Relation Age of Onset   Heart attack Father    Stroke  Father    Hypertension Father    Diabetes Father    Heart attack Paternal Uncle    Anxiety disorder Mother    Cancer Mother        breast   Dementia Paternal Grandmother     Medications:   Current Outpatient Medications on File Prior to Visit  Medication Sig Dispense Refill   adalimumab (HUMIRA, 2 SYRINGE,) 40 MG/0.4ML prefilled syringe Inject 0.4 mLs into the skin every 14 (fourteen) days.     amLODipine  (NORVASC ) 10 MG tablet Take 1 tablet (10 mg total) by mouth daily. 30 tablet 1   azithromycin  (ZITHROMAX ) 250 MG tablet Take 1 tablet (250 mg total) by mouth daily. Take first 2 tablets together, then 1 every day until finished. 6 tablet 0   benazepril  (LOTENSIN ) 40 MG tablet Take 1 tablet (40 mg total) by mouth daily. 90 tablet 1   Brexpiprazole 0.5 MG TABS Take 0.5 mg by mouth daily.     hydrochlorothiazide  (MICROZIDE ) 12.5 MG capsule Take 1 capsule (12.5 mg total) by mouth daily. 30 capsule 1   nitroGLYCERIN  (NITROSTAT ) 0.4 MG SL tablet Place 1 tablet (0.4 mg total) under the tongue every 5 (five) minutes as needed for chest pain. 25 tablet 11   PARoxetine (PAXIL) 20 MG tablet Take 20 mg by mouth daily.     [DISCONTINUED] eplerenone  (INSPRA ) 50 MG tablet Take 1 tablet (50 mg total) by mouth daily. 90 tablet 3   [DISCONTINUED] metoprolol  tartrate (LOPRESSOR ) 50 MG tablet Take 1 tablet (50 mg total) by mouth once for 1 dose. Take 1 hour before cardiac CTA. 1 tablet 0   No current facility-administered medications on file prior to visit.    Allergies:   Allergies  Allergen Reactions   Doxycycline Rash      OBJECTIVE:  Physical Exam General: well developed, well nourished, seated, in no evident distress  Neurologic Exam Mental Status: Awake and fully alert. Oriented to place and time. Recent and remote memory intact. Attention span, concentration and fund of knowledge appropriate. Mood and affect appropriate.        ASSESSMENT/PLAN: Wanda Cellucci is a 43 y.o. year  old male    OSA on CPAP :  Compliance  report shows suboptimal usage due to recurrent sinus infections and congestion.  Hopefully allergies will start to calm down soon and able to use mask more consistently. Do not feel he would get benefit from [facility] due to facial hair Continue current pressure settings of 7-14 with EPR 3.   Discussed importance of nightly usage with ensuring greater than 4 hours nightly for optimal benefit and per insurance purposes.   Continue to follow with DME company for any needed supplies or CPAP related concerns     Follow up in 9 months or call earlier if needed   CC:  PCP: Tonna Frederic, MD    I spent 20 minutes of face-to-face and non-face-to-face time with patient via MyChart video visit.  This included previsit chart review, lab review, study review, order entry, electronic health record documentation, patient education and discussion regarding above diagnoses and treatment plan and answered all other questions to patient's satisfaction  Johny Nap, [facility]  [facility] [address] [place], [place] [address]  Phone [phone] Fax [phone] Note: This document was prepared with digital dictation and possible smart phrase technology. Any transcriptional errors that result from this process are unintentional.

## 2023-07-20 ENCOUNTER — Telehealth (INDEPENDENT_AMBULATORY_CARE_PROVIDER_SITE_OTHER): Payer: No Typology Code available for payment source | Admitting: Adult Health

## 2023-07-20 ENCOUNTER — Encounter: Payer: Self-pay | Admitting: Adult Health

## 2023-07-20 DIAGNOSIS — G4733 Obstructive sleep apnea (adult) (pediatric): Secondary | ICD-10-CM

## 2023-08-01 DIAGNOSIS — G4733 Obstructive sleep apnea (adult) (pediatric): Secondary | ICD-10-CM | POA: Diagnosis not present

## 2023-08-02 ENCOUNTER — Encounter

## 2023-08-02 DIAGNOSIS — J069 Acute upper respiratory infection, unspecified: Secondary | ICD-10-CM | POA: Diagnosis not present

## 2023-08-27 DIAGNOSIS — Z6838 Body mass index (BMI) 38.0-38.9, adult: Secondary | ICD-10-CM | POA: Diagnosis not present

## 2023-08-27 DIAGNOSIS — E6609 Other obesity due to excess calories: Secondary | ICD-10-CM | POA: Diagnosis not present

## 2023-08-27 DIAGNOSIS — F988 Other specified behavioral and emotional disorders with onset usually occurring in childhood and adolescence: Secondary | ICD-10-CM | POA: Diagnosis not present

## 2023-08-27 DIAGNOSIS — F32A Depression, unspecified: Secondary | ICD-10-CM | POA: Diagnosis not present

## 2023-08-27 DIAGNOSIS — I10 Essential (primary) hypertension: Secondary | ICD-10-CM | POA: Diagnosis not present

## 2023-08-27 DIAGNOSIS — F419 Anxiety disorder, unspecified: Secondary | ICD-10-CM | POA: Diagnosis not present

## 2023-08-27 DIAGNOSIS — L4050 Arthropathic psoriasis, unspecified: Secondary | ICD-10-CM | POA: Diagnosis not present

## 2023-08-27 DIAGNOSIS — F17210 Nicotine dependence, cigarettes, uncomplicated: Secondary | ICD-10-CM | POA: Diagnosis not present

## 2023-08-29 DIAGNOSIS — I10 Essential (primary) hypertension: Secondary | ICD-10-CM | POA: Diagnosis not present

## 2023-08-29 DIAGNOSIS — R5383 Other fatigue: Secondary | ICD-10-CM | POA: Diagnosis not present

## 2023-08-29 DIAGNOSIS — E291 Testicular hypofunction: Secondary | ICD-10-CM | POA: Diagnosis not present

## 2023-08-29 DIAGNOSIS — Z114 Encounter for screening for human immunodeficiency virus [HIV]: Secondary | ICD-10-CM | POA: Diagnosis not present

## 2023-08-29 DIAGNOSIS — Z0000 Encounter for general adult medical examination without abnormal findings: Secondary | ICD-10-CM | POA: Diagnosis not present

## 2023-08-29 DIAGNOSIS — E559 Vitamin D deficiency, unspecified: Secondary | ICD-10-CM | POA: Diagnosis not present

## 2023-08-29 DIAGNOSIS — E6609 Other obesity due to excess calories: Secondary | ICD-10-CM | POA: Diagnosis not present

## 2023-08-29 DIAGNOSIS — R0602 Shortness of breath: Secondary | ICD-10-CM | POA: Diagnosis not present

## 2023-09-13 DIAGNOSIS — F988 Other specified behavioral and emotional disorders with onset usually occurring in childhood and adolescence: Secondary | ICD-10-CM | POA: Diagnosis not present

## 2023-09-13 DIAGNOSIS — Z6838 Body mass index (BMI) 38.0-38.9, adult: Secondary | ICD-10-CM | POA: Diagnosis not present

## 2023-09-13 DIAGNOSIS — Z1331 Encounter for screening for depression: Secondary | ICD-10-CM | POA: Diagnosis not present

## 2023-09-13 DIAGNOSIS — E6609 Other obesity due to excess calories: Secondary | ICD-10-CM | POA: Diagnosis not present

## 2023-09-13 DIAGNOSIS — Z1339 Encounter for screening examination for other mental health and behavioral disorders: Secondary | ICD-10-CM | POA: Diagnosis not present

## 2023-09-13 DIAGNOSIS — F411 Generalized anxiety disorder: Secondary | ICD-10-CM | POA: Diagnosis not present

## 2023-09-15 DIAGNOSIS — L4050 Arthropathic psoriasis, unspecified: Secondary | ICD-10-CM | POA: Diagnosis not present

## 2023-09-15 DIAGNOSIS — E785 Hyperlipidemia, unspecified: Secondary | ICD-10-CM | POA: Diagnosis not present

## 2023-09-15 DIAGNOSIS — Z6838 Body mass index (BMI) 38.0-38.9, adult: Secondary | ICD-10-CM | POA: Diagnosis not present

## 2023-09-15 DIAGNOSIS — R7989 Other specified abnormal findings of blood chemistry: Secondary | ICD-10-CM | POA: Diagnosis not present

## 2023-09-15 DIAGNOSIS — E559 Vitamin D deficiency, unspecified: Secondary | ICD-10-CM | POA: Diagnosis not present

## 2023-09-24 DIAGNOSIS — G4733 Obstructive sleep apnea (adult) (pediatric): Secondary | ICD-10-CM | POA: Diagnosis not present

## 2023-10-12 DIAGNOSIS — H5213 Myopia, bilateral: Secondary | ICD-10-CM | POA: Diagnosis not present

## 2023-10-15 DIAGNOSIS — E6609 Other obesity due to excess calories: Secondary | ICD-10-CM | POA: Diagnosis not present

## 2023-10-15 DIAGNOSIS — F17210 Nicotine dependence, cigarettes, uncomplicated: Secondary | ICD-10-CM | POA: Diagnosis not present

## 2023-10-15 DIAGNOSIS — I10 Essential (primary) hypertension: Secondary | ICD-10-CM | POA: Diagnosis not present

## 2023-10-15 DIAGNOSIS — Z6837 Body mass index (BMI) 37.0-37.9, adult: Secondary | ICD-10-CM | POA: Diagnosis not present

## 2023-10-23 DIAGNOSIS — L409 Psoriasis, unspecified: Secondary | ICD-10-CM | POA: Diagnosis not present

## 2023-10-23 DIAGNOSIS — E6609 Other obesity due to excess calories: Secondary | ICD-10-CM | POA: Diagnosis not present

## 2023-10-23 DIAGNOSIS — F988 Other specified behavioral and emotional disorders with onset usually occurring in childhood and adolescence: Secondary | ICD-10-CM | POA: Diagnosis not present

## 2023-10-23 DIAGNOSIS — Z6837 Body mass index (BMI) 37.0-37.9, adult: Secondary | ICD-10-CM | POA: Diagnosis not present

## 2023-10-23 DIAGNOSIS — F411 Generalized anxiety disorder: Secondary | ICD-10-CM | POA: Diagnosis not present

## 2023-10-23 DIAGNOSIS — R030 Elevated blood-pressure reading, without diagnosis of hypertension: Secondary | ICD-10-CM | POA: Diagnosis not present

## 2023-10-26 DIAGNOSIS — G4733 Obstructive sleep apnea (adult) (pediatric): Secondary | ICD-10-CM | POA: Diagnosis not present

## 2023-11-13 DIAGNOSIS — E559 Vitamin D deficiency, unspecified: Secondary | ICD-10-CM | POA: Diagnosis not present

## 2023-11-13 DIAGNOSIS — E785 Hyperlipidemia, unspecified: Secondary | ICD-10-CM | POA: Diagnosis not present

## 2023-11-13 DIAGNOSIS — Z6837 Body mass index (BMI) 37.0-37.9, adult: Secondary | ICD-10-CM | POA: Diagnosis not present

## 2023-11-13 DIAGNOSIS — E6609 Other obesity due to excess calories: Secondary | ICD-10-CM | POA: Diagnosis not present

## 2023-11-13 DIAGNOSIS — I10 Essential (primary) hypertension: Secondary | ICD-10-CM | POA: Diagnosis not present

## 2023-12-11 DIAGNOSIS — E785 Hyperlipidemia, unspecified: Secondary | ICD-10-CM | POA: Diagnosis not present

## 2023-12-11 DIAGNOSIS — I10 Essential (primary) hypertension: Secondary | ICD-10-CM | POA: Diagnosis not present

## 2023-12-11 DIAGNOSIS — Z6836 Body mass index (BMI) 36.0-36.9, adult: Secondary | ICD-10-CM | POA: Diagnosis not present

## 2023-12-11 DIAGNOSIS — E559 Vitamin D deficiency, unspecified: Secondary | ICD-10-CM | POA: Diagnosis not present

## 2023-12-11 DIAGNOSIS — E6609 Other obesity due to excess calories: Secondary | ICD-10-CM | POA: Diagnosis not present

## 2024-01-15 DIAGNOSIS — E6609 Other obesity due to excess calories: Secondary | ICD-10-CM | POA: Diagnosis not present

## 2024-01-15 DIAGNOSIS — E785 Hyperlipidemia, unspecified: Secondary | ICD-10-CM | POA: Diagnosis not present

## 2024-01-15 DIAGNOSIS — Z6836 Body mass index (BMI) 36.0-36.9, adult: Secondary | ICD-10-CM | POA: Diagnosis not present

## 2024-01-15 DIAGNOSIS — I10 Essential (primary) hypertension: Secondary | ICD-10-CM | POA: Diagnosis not present

## 2024-01-15 DIAGNOSIS — E559 Vitamin D deficiency, unspecified: Secondary | ICD-10-CM | POA: Diagnosis not present

## 2024-02-19 DIAGNOSIS — G4733 Obstructive sleep apnea (adult) (pediatric): Secondary | ICD-10-CM | POA: Diagnosis not present

## 2024-02-19 DIAGNOSIS — Z6836 Body mass index (BMI) 36.0-36.9, adult: Secondary | ICD-10-CM | POA: Diagnosis not present

## 2024-02-19 DIAGNOSIS — E6609 Other obesity due to excess calories: Secondary | ICD-10-CM | POA: Diagnosis not present

## 2024-03-05 ENCOUNTER — Ambulatory Visit
Admission: EM | Admit: 2024-03-05 | Discharge: 2024-03-05 | Disposition: A | Discharge status: Home / Self Care | Attending: Internal Medicine | Admitting: Internal Medicine

## 2024-03-05 ENCOUNTER — Encounter: Payer: Self-pay | Admitting: Emergency Medicine

## 2024-03-05 DIAGNOSIS — R52 Pain, unspecified: Secondary | ICD-10-CM

## 2024-03-05 DIAGNOSIS — R051 Acute cough: Secondary | ICD-10-CM

## 2024-03-05 DIAGNOSIS — J069 Acute upper respiratory infection, unspecified: Secondary | ICD-10-CM

## 2024-03-05 LAB — POCT INFLUENZA A/B
Influenza A, POC: NEGATIVE
Influenza B, POC: NEGATIVE

## 2024-03-05 LAB — POC SOFIA SARS ANTIGEN FIA: SARS Coronavirus 2 Ag: NEGATIVE

## 2024-03-05 NOTE — Discharge Instructions (Addendum)
 Flu A, flu B and COVID testing done today are negative. Symptoms are most consistent with a viral infection.  This does not require antibiotic treatment.  We focus treatment on improving the symptoms.  Physical exam findings and vital signs are reassuring.  We will treat with the following:  Benzonatate  (tessalon ) 100 mg every 8 hours as needed for cough. May alternate Tylenol  and ibuprofen for fever or pain Can try Nasacort to help with runny nose.  Make sure to stay hydrated by drinking plenty of water. Return to urgent care or PCP if symptoms worsen or fail to resolve.

## 2024-03-05 NOTE — ED Triage Notes (Signed)
 Pt c/o fatigue, fever, body aches, right ear fullness, congestion, cough since Thursday

## 2024-03-05 NOTE — ED Provider Notes (Signed)
 " [facility]    CSN: [id number] Arrival date & time: 03/05/24  1259      History   Chief Complaint Chief Complaint  Patient presents with   Fever   Generalized Body Aches    HPI Selah Zelman is a 44 y.o. male.   44 year old male who presents urgent care with complaints of fever, body aches, fatigue, cough, congestion and right ear fullness.  His symptoms started on Thursday night.  He has been taking Tylenol  for his symptoms.  He denies any shortness of breath or chest pain.  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   Fever Associated symptoms: chills, congestion, cough, ear pain and myalgias   Associated symptoms: no chest pain, no dysuria, no rash, no sore throat and no vomiting     Past Medical History:  Diagnosis Date   Allergy    Anxiety    Ear congestion, bilateral 03/08/2017   Last Assessment & Plan:  Patient notes recurrent infections in the past. States he has a congestion/fluid-like feeling in the [place] today. States that his been going on for a couple weeks. Notes that it is concurrent with increase in fatigue. Denies any true pain. Denies any fever. No recent upper respiratory infection symptoms. Physical exam does not reveal any signs of infection or fluid behind    Essential hypertension 06/28/2014   Fatigue 03/08/2017   Last Assessment & Plan:  Patient notices new onset of fatigue 4 weeks ago. He states that there is no changes in his routine. He denies any changes in medication or diet. No evidence of active bleeding. Does note that he is noncompliant with his CPAP machine. Advised that we would do blood work today. Patient refused and states that he would like to repeat in 4 weeks as he has not had anything to    Herniated disc    Hypertension    Insomnia 03/08/2017   Last Assessment & Plan:  Patient notes some difficulty sleeping when he is  trying to wear his CPAP machine. He states that also he has a little bit of restlessness at night. Is inquiring for a sleep aid to help him with these symptoms. Has tried melatonin in the past. Will prescribe Rozerem 8 mg tablets as a starting point to help with difficulty sleeping, in order to try and avoid increasing fati   Non-cardiac chest pain    Numbness and tingling in right hand 03/08/2017   Last Assessment & Plan:  Occurs mostly at night. Noticing symptoms are worsening. Notices some tingling and numbness in the right hand. Has not used any treatments thus far. Advised to use a wrist brace at night to help with likely carpal tunnel-like symptoms. Advised to follow up in 4 weeks.   Obese    Obesity (BMI 35.0-39.9 without comorbidity) 06/25/2015   OSA (obstructive sleep apnea) 09/30/2015   Overview:  08/2015  HSAT  AHI 21.4  Sat 80 % Average Sat 92%  Last Assessment & Plan:  Patient previously diagnosed. Noncompliant with CPAP at this time. Expereincing increased fatigue. Notes excessive daytime tiredness. States that he has difficulty sleeping with CPAP mask. Is recommended to wear the full mask as he sleeps with his mouth open. Has not been back to see a sleep doctor recently. [facility]   Psoriasis 06/28/2014   Under care of dermatologist, Dr Tanda  Rhinitis, allergic 06/28/2014   Shoulder pain    left   Smoker    Tobacco use 04/09/2017   Last Assessment & Plan:  Smoking cessation desired. Previously failed chantix due to irritability. > 1PPD. 21 pack-year history. Will start bupropion  150mg  for 3 days and increase to 300mg . Patient reports readiness for quitting. Offered talk therapy referral. 1800 QUIT LINE as well. Patient will try medications. 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 Authorizing Provider  adalimumab (HUMIRA, 2 SYRINGE,) 40 MG/0.4ML prefilled syringe Inject 0.4 mLs into the skin every 14 (fourteen) days.    [provider]  amLODipine  (NORVASC ) 10 MG tablet Take 1 tablet (10 mg total) by mouth daily. 07/20/22   Berneta Elsie Sayre, MD  azithromycin  (ZITHROMAX ) 250 MG tablet Take 1 tablet (250 mg total) by mouth daily. Take first 2 tablets together, then 1 every day until finished. 04/01/23   Acevedo, Angela, PA  benazepril  (LOTENSIN ) 40 MG tablet Take 1 tablet (40 mg total) by mouth daily. 06/23/21     Brexpiprazole 0.5 MG TABS Take 0.5 mg by mouth daily.    [provider]  hydrochlorothiazide  (MICROZIDE ) 12.5 MG capsule Take 1 capsule (12.5 mg total) by mouth daily. 07/20/22   Berneta Elsie Sayre, MD  nitroGLYCERIN  (NITROSTAT ) 0.4 MG SL tablet Place 1 tablet (0.4 mg total) under the tongue every 5 (five) minutes as needed for chest pain. 05/13/17 10/30/21  Monetta Redell PARAS, MD  PARoxetine (PAXIL) 20 MG tablet Take 20 mg by mouth daily.    [provider]  eplerenone  (INSPRA ) 50 MG tablet Take 1 tablet (50 mg total) by mouth daily. 05/13/17 07/25/19  Monetta Redell PARAS, MD  metoprolol  tartrate (LOPRESSOR ) 50 MG tablet Take 1 tablet (50 mg total) by mouth once for 1 dose. Take 1 hour before cardiac CTA. 05/13/17 07/25/19  Monetta Redell PARAS, MD    Family History Family History  Problem Relation Age of Onset    Heart attack Father    Stroke Father    Hypertension Father    Diabetes Father    Heart attack Paternal Uncle    Anxiety disorder Mother    Cancer Mother        breast   Dementia Paternal Grandmother     Social History Social History[1]   Allergies   Doxycycline   Review of Systems Review of Systems  Constitutional:  Positive for chills, fatigue and fever.  HENT:  Positive for congestion and ear pain. Negative for sore throat.   Eyes:  Negative for pain and visual disturbance.  Respiratory:  Positive for cough. Negative for shortness of breath.   Cardiovascular:  Negative for chest pain and palpitations.  Gastrointestinal:  Negative for abdominal pain and vomiting.  Genitourinary:  Negative for dysuria and hematuria.  Musculoskeletal:  Positive for myalgias. Negative for arthralgias and back pain.  Skin:  Negative for color change and rash.  Neurological:  Negative for seizures and syncope.  All other systems reviewed and are negative.    Physical Exam Triage Vital Signs ED Triage Vitals  Encounter Vitals Group     BP 03/05/24 1309 (!) 159/116     Girls Systolic BP Percentile --      Girls Diastolic BP Percentile --      Boys Systolic BP Percentile --      Boys Diastolic BP Percentile --      Pulse Rate 03/05/24 1309 (!) 103     Resp 03/05/24 1309 16     Temp 03/05/24 1309 98.3 F (36.8 C)     Temp Source 03/05/24 1309 Oral     SpO2 03/05/24 1309 97 %     Weight --      Height --      Head Circumference --      Peak Flow --      Pain Score 03/05/24 1313 5     Pain Loc --      Pain Education --      Exclude from Growth Chart --    No data found.  Updated Vital Signs BP (!) 159/116 (BP Location: Right Arm) Comment: Pt has not taken bp meds last few days due to being sick  Pulse (!) 103   Temp 98.3 F (36.8 C) (Oral)   Resp 16   SpO2 97%   Visual Acuity Right Eye Distance:   Left Eye Distance:   Bilateral Distance:    Right Eye Near:   Left Eye  Near:    Bilateral Near:     Physical Exam Vitals and nursing note reviewed.  Constitutional:      General: He is not in acute distress.    Appearance: He is well-developed.  HENT:     Head: Normocephalic and atraumatic.     Right Ear: Tympanic membrane normal.     Left Ear: Tympanic membrane normal.     Nose: Congestion present.     Mouth/Throat:     Mouth: Mucous membranes are moist.  Eyes:     Conjunctiva/sclera: Conjunctivae normal.  Cardiovascular:     Rate and Rhythm: Normal rate and regular rhythm.     Heart sounds: No murmur heard. Pulmonary:     Effort: Pulmonary effort is normal. No respiratory distress.     Breath sounds: Normal breath sounds.  Abdominal:     Palpations: Abdomen is soft.     Tenderness: There is no abdominal tenderness.  Musculoskeletal:        General: No swelling.     Cervical back: Neck supple.  Skin:    General: Skin is warm and dry.     Capillary Refill: Capillary refill takes less than 2 seconds.  Neurological:     General: No focal deficit present.     Mental Status: He is alert.  Psychiatric:        Mood and Affect: Mood normal.      UC Treatments / Results  Labs (all labs ordered are listed, but only abnormal results are displayed) Labs Reviewed  POCT INFLUENZA A/B  POC SOFIA SARS ANTIGEN FIA    EKG   Radiology No results found.  Procedures Procedures (including critical care time)  Medications Ordered in UC Medications -  No data to display  Initial Impression / Assessment and Plan / UC Course  I have reviewed the triage vital signs and the nursing notes.  Pertinent labs & imaging results that were available during my care of the patient were reviewed by me and considered in my medical decision making (see chart for details).     Acute cough  Body aches  Viral upper respiratory tract infection with cough   Flu A, flu B and COVID testing done today are negative. Symptoms are most consistent with a viral  infection.  This does not require antibiotic treatment.  We focus treatment on improving the symptoms.  Physical exam findings and vital signs are reassuring.  We will treat with the following:  Benzonatate  (tessalon ) 100 mg every 8 hours as needed for cough. May alternate Tylenol  and ibuprofen for fever or pain Can try Nasacort to help with runny nose.  Make sure to stay hydrated by drinking plenty of water. Return to urgent care or PCP if symptoms worsen or fail to resolve.    Final Clinical Impressions(s) / UC Diagnoses   Final diagnoses:  Acute cough  Body aches  Viral upper respiratory tract infection with cough     Discharge Instructions      Flu A, flu B and COVID testing done today are negative. Symptoms are most consistent with a viral infection.  This does not require antibiotic treatment.  We focus treatment on improving the symptoms.  Physical exam findings and vital signs are reassuring.  We will treat with the following:  Benzonatate  (tessalon ) 100 mg every 8 hours as needed for cough. May alternate Tylenol  and ibuprofen for fever or pain Can try Nasacort to help with runny nose.  Make sure to stay hydrated by drinking plenty of water. Return to urgent care or PCP if symptoms worsen or fail to resolve.       ED Prescriptions   None    PDMP not reviewed this encounter.    [1]  Social History Tobacco Use   Smoking status: Every Day    Current packs/day: 1.00    Types: Cigarettes   Smokeless tobacco: Never  Vaping Use   Vaping status: Former  Substance Use Topics   Alcohol use: Not Currently   Drug use: No     Teresa Almarie LABOR, PA-C 03/05/24 1337  "
